# Patient Record
Sex: Female | Born: 1980
Health system: Southern US, Community
[De-identification: ages and names within clinical notes are randomized; demographics above are authoritative.]

## PROBLEM LIST (undated history)

## (undated) DIAGNOSIS — E039 Hypothyroidism, unspecified: Secondary | ICD-10-CM

## (undated) DIAGNOSIS — F32A Depression, unspecified: Secondary | ICD-10-CM

## (undated) DIAGNOSIS — E282 Polycystic ovarian syndrome: Secondary | ICD-10-CM

## (undated) DIAGNOSIS — Z62810 Personal history of physical and sexual abuse in childhood: Secondary | ICD-10-CM

## (undated) DIAGNOSIS — F329 Major depressive disorder, single episode, unspecified: Secondary | ICD-10-CM

## (undated) DIAGNOSIS — D219 Benign neoplasm of connective and other soft tissue, unspecified: Secondary | ICD-10-CM

## (undated) DIAGNOSIS — K219 Gastro-esophageal reflux disease without esophagitis: Secondary | ICD-10-CM

## (undated) HISTORY — DX: Personal history of physical and sexual abuse in childhood: Z62.810

## (undated) HISTORY — DX: Gastro-esophageal reflux disease without esophagitis: K21.9

---

## 2010-11-14 NOTE — L&D Delivery Note (Signed)
Delivery Note At 11:35 AM a viable and healthy female, Renee Thompson,  was delivered via VBAC, Spontaneous (Presentation: Right Occiput Anterior).  APGAR: 6, 8; weight 8 lb 3.4 oz (3725 g).   Placenta status: Intact, Spontaneous.  Cord: 3 vessels with the following complications: None.  Cord pH: na Labored down x 2 hours, pushed x 2 contractions and delivered.  Anesthesia: Epidural  Episiotomy: None Lacerations: 2nd degree;Perineal Suture Repair: 3.0 vicryl Est. Blood Loss (mL): 375  Mom to postpartum.  Baby to skin to skin.  Nigel Bridgeman 10/12/2011, 12:41 PM

## 2010-12-10 DIAGNOSIS — E282 Polycystic ovarian syndrome: Secondary | ICD-10-CM | POA: Insufficient documentation

## 2011-05-04 ENCOUNTER — Other Ambulatory Visit: Payer: Self-pay | Admitting: Endocrinology

## 2011-05-04 DIAGNOSIS — E049 Nontoxic goiter, unspecified: Secondary | ICD-10-CM

## 2011-07-19 ENCOUNTER — Other Ambulatory Visit: Payer: BC Managed Care – PPO

## 2011-07-21 ENCOUNTER — Inpatient Hospital Stay (HOSPITAL_COMMUNITY)
Admission: AD | Admit: 2011-07-21 | Discharge: 2011-07-21 | Disposition: A | Discharge status: Home / Self Care | Payer: BC Managed Care – PPO | Source: Ambulatory Visit | Attending: Obstetrics and Gynecology | Admitting: Obstetrics and Gynecology

## 2011-07-21 DIAGNOSIS — Z2989 Encounter for other specified prophylactic measures: Secondary | ICD-10-CM | POA: Insufficient documentation

## 2011-07-21 DIAGNOSIS — Z298 Encounter for other specified prophylactic measures: Secondary | ICD-10-CM | POA: Insufficient documentation

## 2011-07-21 DIAGNOSIS — Z3480 Encounter for supervision of other normal pregnancy, unspecified trimester: Secondary | ICD-10-CM | POA: Insufficient documentation

## 2011-07-21 MED ORDER — RHO D IMMUNE GLOBULIN 1500 UNIT/2ML IJ SOLN
300.0000 ug | Freq: Once | INTRAMUSCULAR | Status: AC
  Administered 2011-07-21: 300 ug via INTRAMUSCULAR
  Filled 2011-07-21: qty 2

## 2011-07-21 NOTE — Plan of Care (Signed)
Pt given the information booklet on Rhophylac. Explained the time of 1 1/2 hours to complete the process the order. Pt states she will leave after the blood is drawn.

## 2011-07-22 LAB — RH IG WORKUP (INCLUDES ABO/RH)
ABO/RH(D): O NEG
Antibody Screen: NEGATIVE
Unit division: 0

## 2011-10-11 ENCOUNTER — Encounter (HOSPITAL_COMMUNITY): Payer: Self-pay | Admitting: Anesthesiology

## 2011-10-11 ENCOUNTER — Inpatient Hospital Stay (HOSPITAL_COMMUNITY)
Admission: AD | Admit: 2011-10-11 | Discharge: 2011-10-14 | DRG: 373 | Disposition: A | Discharge status: Home / Self Care | Payer: BC Managed Care – PPO | Source: Ambulatory Visit | Attending: Obstetrics and Gynecology | Admitting: Obstetrics and Gynecology

## 2011-10-11 ENCOUNTER — Inpatient Hospital Stay (HOSPITAL_COMMUNITY): Payer: BC Managed Care – PPO | Admitting: Anesthesiology

## 2011-10-11 ENCOUNTER — Encounter (HOSPITAL_COMMUNITY): Payer: Self-pay | Admitting: *Deleted

## 2011-10-11 DIAGNOSIS — Z8659 Personal history of other mental and behavioral disorders: Secondary | ICD-10-CM

## 2011-10-11 DIAGNOSIS — O99892 Other specified diseases and conditions complicating childbirth: Secondary | ICD-10-CM | POA: Diagnosis present

## 2011-10-11 DIAGNOSIS — O9903 Anemia complicating the puerperium: Secondary | ICD-10-CM | POA: Diagnosis not present

## 2011-10-11 DIAGNOSIS — Z22330 Carrier of Group B streptococcus: Secondary | ICD-10-CM

## 2011-10-11 DIAGNOSIS — O99284 Endocrine, nutritional and metabolic diseases complicating childbirth: Secondary | ICD-10-CM | POA: Diagnosis present

## 2011-10-11 DIAGNOSIS — E079 Disorder of thyroid, unspecified: Secondary | ICD-10-CM | POA: Diagnosis present

## 2011-10-11 DIAGNOSIS — E063 Autoimmune thyroiditis: Secondary | ICD-10-CM | POA: Diagnosis present

## 2011-10-11 DIAGNOSIS — O34219 Maternal care for unspecified type scar from previous cesarean delivery: Secondary | ICD-10-CM | POA: Diagnosis present

## 2011-10-11 DIAGNOSIS — Z3490 Encounter for supervision of normal pregnancy, unspecified, unspecified trimester: Secondary | ICD-10-CM

## 2011-10-11 DIAGNOSIS — E039 Hypothyroidism, unspecified: Secondary | ICD-10-CM | POA: Diagnosis present

## 2011-10-11 DIAGNOSIS — D649 Anemia, unspecified: Secondary | ICD-10-CM | POA: Diagnosis not present

## 2011-10-11 DIAGNOSIS — Z6791 Unspecified blood type, Rh negative: Secondary | ICD-10-CM

## 2011-10-11 HISTORY — DX: Polycystic ovarian syndrome: E28.2

## 2011-10-11 HISTORY — DX: Depression, unspecified: F32.A

## 2011-10-11 HISTORY — DX: Hypothyroidism, unspecified: E03.9

## 2011-10-11 HISTORY — DX: Major depressive disorder, single episode, unspecified: F32.9

## 2011-10-11 HISTORY — DX: Benign neoplasm of connective and other soft tissue, unspecified: D21.9

## 2011-10-11 LAB — CBC
Hemoglobin: 12 g/dL (ref 12.0–15.0)
RBC: 5.35 MIL/uL — ABNORMAL HIGH (ref 3.87–5.11)

## 2011-10-11 LAB — COMPREHENSIVE METABOLIC PANEL
ALT: 10 U/L (ref 0–35)
AST: 16 U/L (ref 0–37)
Albumin: 2.8 g/dL — ABNORMAL LOW (ref 3.5–5.2)
CO2: 22 mEq/L (ref 19–32)
Calcium: 9.6 mg/dL (ref 8.4–10.5)
Chloride: 103 mEq/L (ref 96–112)
GFR calc non Af Amer: >90 mL/min (ref >90)
Sodium: 137 mEq/L (ref 135–145)
Total Bilirubin: 0.2 mg/dL — ABNORMAL LOW (ref 0.3–1.2)

## 2011-10-11 LAB — STREP B DNA PROBE: GBS: POSITIVE

## 2011-10-11 LAB — RPR: RPR: NONREACTIVE

## 2011-10-11 MED ORDER — EPHEDRINE 5 MG/ML INJ
10.0000 mg | INTRAVENOUS | Status: DC | PRN
  Administered 2011-10-12: 10 mg via INTRAVENOUS
  Filled 2011-10-11 (×2): qty 4

## 2011-10-11 MED ORDER — LACTATED RINGERS IV SOLN
500.0000 mL | INTRAVENOUS | Status: DC | PRN
  Administered 2011-10-12 (×3): 300 mL via INTRAVENOUS

## 2011-10-11 MED ORDER — PENICILLIN G POTASSIUM 5000000 UNITS IJ SOLR
5.0000 10*6.[IU] | Freq: Once | INTRAVENOUS | Status: AC
  Administered 2011-10-11: 5 10*6.[IU] via INTRAVENOUS
  Filled 2011-10-11: qty 5

## 2011-10-11 MED ORDER — FENTANYL 2.5 MCG/ML BUPIVACAINE 1/10 % EPIDURAL INFUSION (WH - ANES)
INTRAMUSCULAR | Status: DC | PRN
  Administered 2011-10-11: 14 mL/h via EPIDURAL

## 2011-10-11 MED ORDER — DIPHENHYDRAMINE HCL 50 MG/ML IJ SOLN: 12.5000 mg | INTRAMUSCULAR | Status: DC | PRN

## 2011-10-11 MED ORDER — DEXTROSE 5 % IV SOLN
2.5000 10*6.[IU] | INTRAVENOUS | Status: DC
  Administered 2011-10-11 – 2011-10-12 (×3): 2.5 10*6.[IU] via INTRAVENOUS
  Filled 2011-10-11 (×8): qty 2.5

## 2011-10-11 MED ORDER — CITRIC ACID-SODIUM CITRATE 334-500 MG/5ML PO SOLN: 30.0000 mL | ORAL | Status: DC | PRN

## 2011-10-11 MED ORDER — OXYTOCIN BOLUS FROM INFUSION
500.0000 mL | Freq: Once | INTRAVENOUS | Status: DC
  Filled 2011-10-11: qty 500
  Filled 2011-10-11: qty 1000

## 2011-10-11 MED ORDER — FENTANYL 2.5 MCG/ML BUPIVACAINE 1/10 % EPIDURAL INFUSION (WH - ANES)
14.0000 mL/h | INTRAMUSCULAR | Status: DC
  Administered 2011-10-12 (×3): 14 mL/h via EPIDURAL
  Filled 2011-10-11 (×4): qty 60

## 2011-10-11 MED ORDER — OXYTOCIN 10 UNIT/ML IJ SOLN: 10.0000 [IU] | Freq: Once | INTRAMUSCULAR | Status: DC

## 2011-10-11 MED ORDER — PHENYLEPHRINE 40 MCG/ML (10ML) SYRINGE FOR IV PUSH (FOR BLOOD PRESSURE SUPPORT)
80.0000 ug | PREFILLED_SYRINGE | INTRAVENOUS | Status: DC | PRN
  Filled 2011-10-11: qty 5

## 2011-10-11 MED ORDER — ACETAMINOPHEN 325 MG PO TABS: 650.0000 mg | ORAL_TABLET | ORAL | Status: DC | PRN

## 2011-10-11 MED ORDER — OXYTOCIN 20 UNITS IN LACTATED RINGERS INFUSION - SIMPLE: 125.0000 mL/h | Freq: Once | INTRAVENOUS | Status: DC

## 2011-10-11 MED ORDER — PHENYLEPHRINE 40 MCG/ML (10ML) SYRINGE FOR IV PUSH (FOR BLOOD PRESSURE SUPPORT): 80.0000 ug | PREFILLED_SYRINGE | INTRAVENOUS | Status: DC | PRN

## 2011-10-11 MED ORDER — IBUPROFEN 600 MG PO TABS: 600.0000 mg | ORAL_TABLET | Freq: Four times a day (QID) | ORAL | Status: DC | PRN

## 2011-10-11 MED ORDER — LACTATED RINGERS IV SOLN
500.0000 mL | Freq: Once | INTRAVENOUS | Status: AC
  Administered 2011-10-11: 500 mL via INTRAVENOUS

## 2011-10-11 MED ORDER — OXYCODONE-ACETAMINOPHEN 5-325 MG PO TABS: 2.0000 | ORAL_TABLET | ORAL | Status: DC | PRN

## 2011-10-11 MED ORDER — ONDANSETRON HCL 4 MG/2ML IJ SOLN: 4.0000 mg | Freq: Four times a day (QID) | INTRAMUSCULAR | Status: DC | PRN

## 2011-10-11 MED ORDER — LIDOCAINE HCL (PF) 1 % IJ SOLN
30.0000 mL | INTRAMUSCULAR | Status: DC | PRN
  Administered 2011-10-12: 30 mL via SUBCUTANEOUS
  Filled 2011-10-11: qty 30

## 2011-10-11 MED ORDER — EPHEDRINE 5 MG/ML INJ: 10.0000 mg | INTRAVENOUS | Status: DC | PRN

## 2011-10-11 MED ORDER — SODIUM BICARBONATE 8.4 % IV SOLN
INTRAVENOUS | Status: DC | PRN
  Administered 2011-10-11: 5 mL via EPIDURAL

## 2011-10-11 MED ORDER — LACTATED RINGERS IV SOLN
INTRAVENOUS | Status: DC
  Administered 2011-10-11 – 2011-10-12 (×4): via INTRAVENOUS

## 2011-10-11 NOTE — Anesthesia Preprocedure Evaluation (Signed)
Anesthesia Evaluation  Patient identified by MRN, date of birth, ID band Patient awake    Reviewed: Allergy & Precautions, H&P , Patient's Chart, lab work & pertinent test results  Airway Mallampati: III TM Distance: >3 FB Neck ROM: full    Dental  (+) Teeth Intact   Pulmonary  clear to auscultation        Cardiovascular regular Normal    Neuro/Psych    GI/Hepatic   Endo/Other  Morbid obesity  Renal/GU      Musculoskeletal   Abdominal   Peds  Hematology   Anesthesia Other Findings       Reproductive/Obstetrics (+) Pregnancy                           Anesthesia Physical Anesthesia Plan  ASA: III  Anesthesia Plan: Epidural   Post-op Pain Management:    Induction:   Airway Management Planned:   Additional Equipment:   Intra-op Plan:   Post-operative Plan:   Informed Consent: I have reviewed the patients History and Physical, chart, labs and discussed the procedure including the risks, benefits and alternatives for the proposed anesthesia with the patient or authorized representative who has indicated his/her understanding and acceptance.   Dental Advisory Given  Plan Discussed with:   Anesthesia Plan Comments: (Labs checked- platelets confirmed with RN in room. Fetal heart tracing, per RN, reported to be stable enough for sitting procedure. Discussed epidural, and patient consents to the procedure:  included risk of possible headache,backache, failed block, allergic reaction, and nerve injury. This patient was asked if she had any questions or concerns before the procedure started. )        Anesthesia Quick Evaluation

## 2011-10-11 NOTE — Progress Notes (Signed)
Patient ID: Artemisa Sladek, female   DOB: 01/26/1981, 30 y.o.   MRN: [id number] .Subjective: Coping with ctx, ambulating and changing positions, on hands and knees in shower now. Declines any pain meds at present. Difficulty tracing FHR secondary to pt movement and body habitus   Objective: BP 131/77  Pulse 104  Temp(Src) 97.3 F (36.3 C) (Oral)  Resp 20  Ht 5\' 8"  (1.727 m)  Wt 127.914 kg (282 lb)  BMI 42.88 kg/m2   FHT:  FHR: 140 bpm, variability: moderate,  accelerations:  Present,  decelerations:  Absent UC:   regular, every 4-5 minutes SVE:   Dilation: 4.5 Effacement (%): 100 Station: -1 Exam by:: e.foley,rn    Assessment / Plan: Spontaneous labor, progressing normally GBS pos, PCN given   Fetal Wellbeing:  Category I Pain Control:  Labor support without medications  FHR overall reassuring  BP stable, PIH labs normal   Update physician PRN  Malissa Hippo 10/11/2011, 8:18 PM

## 2011-10-11 NOTE — Progress Notes (Signed)
Regular ctx's for an hour now 4-48min.  No bleeding,no water leaking.  Bloody mucous over weekend.  Primary c/s for FTP

## 2011-10-11 NOTE — H&P (Signed)
Renee Thompson is a 30 y.o. female presenting for onset of ctx this afternoon and have been 4-5 min apart and getting stronger. Has had some bloody mucous discharge but no LOF, reports +FM. Pregnancy significant for: 1. +GBS 2. Hashimoto's dx, on synthroid, has been managed by Dr. Talmage Nap 3. HX C/S for FTP, desires TOLAC 4. Hx PPD   Maternal Medical History:  Reason for admission: Reason for admission: contractions.  Contractions: Onset was 1-2 hours ago.   Frequency: regular.   Duration is approximately 60 seconds.   Perceived severity is moderate.    Fetal activity: Perceived fetal activity is normal.   Last perceived fetal movement was within the past hour.    Prenatal complications: no prenatal complications   OB History    Grav Para Term Preterm Abortions TAB SAB Ect Mult Living   2 1 1       1      Past Medical History  Diagnosis Date  . Depression     postpartum  . Fibroid     diagnosed 2010 pregnancy  . Hypothyroidism     on synthroid  . Polycystic ovarian syndrome    Past Surgical History  Procedure Date  . Cesarean section     failure to progress   Family History: family history is not on file. Varicose veins - mom;  Diabetes - MGM;  Epilepsy - brother;  Testicular CA - 1st cousin Social History:  does not have a smoking history on file. She does not have any smokeless tobacco history on file. She reports that she does not drink alcohol or use illicit drugs.  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   Review of Systems  All other systems reviewed and are negative.    Dilation: 3 Effacement (%): 100 Station: 0 Exam by:: Sanda Klein, cnm Blood pressure 151/84, pulse 104, temperature 97.3 F (36.3 C), temperature source Oral, resp. rate 20, height 5\' 8"  (1.727 m), weight 127.914 kg (282 lb).   Maternal Exam:  Uterine Assessment: Contraction strength is mild.  Contraction duration is 60 seconds. Contraction frequency  is regular.   Abdomen: Estimated fetal weight is 8#.   Fetal presentation: vertex  Introitus: Normal vulva. Normal vagina.  Ferning test: not done.   Pelvis: adequate for delivery.   Cervix: Cervix evaluated by digital exam.     Physical Exam  Nursing note and vitals reviewed. Constitutional: She is oriented to person, place, and time. She appears well-developed and well-nourished.  Neck: Normal range of motion.  Cardiovascular: Normal rate and normal heart sounds.   Respiratory: Effort normal.  GI: Soft. There is no tenderness.  Genitourinary: Vagina normal.  Musculoskeletal: Normal range of motion.  Neurological: She is alert and oriented to person, place, and time.  Skin: Skin is warm and dry.  Psychiatric: She has a normal mood and affect. Her behavior is normal.    Prenatal labs: ABO, Rh: --/--/O NEG (09/06 1315) Antibody: NEG (09/06 1315) Rubella: Immune (11/27 0000) RPR: Nonreactive (11/27 0000)  HBsAg: Negative (11/27 0000)  HIV: Non-reactive (11/27 0000)  GBS: Positive (11/27 0000)  1hr gtt normal Per pt - last TSH in Sept was 1.8    Assessment/Plan: IUP at [redacted]w[redacted]d Early/active labor +GBS Desires TOLAC Hashimoto's dx - stable on synthroid Hx PPD Prefers non-interventive birth, has doula BP slightly elevated, will continue to observe and check PIH labs  Admit to birthing suites, CNM care, Dr Pennie Rushing attending PCN  per protocol for GBS prophylaxis Routine CNM orders  LILLARD,SHELLEY M 10/11/2011, 6:18 PM

## 2011-10-11 NOTE — Anesthesia Procedure Notes (Signed)
Epidural Patient location during procedure: OB  Preanesthetic Checklist Completed: patient identified, site marked, surgical consent, pre-op evaluation, timeout performed, IV checked, risks and benefits discussed and monitors and equipment checked  Epidural Patient position: sitting Prep: site prepped and draped and DuraPrep Patient monitoring: continuous pulse ox and blood pressure Approach: midline Injection technique: LOR air  Needle:  Needle type: Tuohy  Needle gauge: 17 G Needle length: 9 cm Needle insertion depth: 6 cm Catheter type: closed end flexible Catheter size: 19 Gauge Catheter at skin depth: 12 cm Test dose: negative  Assessment Events: blood not aspirated, injection not painful, no injection resistance, negative IV test and no paresthesia  Additional Notes Dosing of Epidural:  1st dose, through needle ............................................. epi 1:200K + Xylocaine 40 mg  2nd dose, through catheter, after waiting 3 minutes.....epi 1:200K + Xylocaine 60 mg  3rd dose, through catheter after waiting 3 minutes .............................Marcaine   5mg   ( mg Marcaine are expressed as equivilent  cc's medication removed from the 0.1%Bupiv / fentanyl syringe from L&D pump)  ( 2% Xylo charted as a single dose in Epic Meds for ease of charting; actual dosing was fractionated as above, for saftey's sake)  As each dose occurred, patient was free of IV sx; and patient exhibited no evidence of SA injection.  Patient is more comfortable after epidural dosed. Please see RN's note for documentation of vital signs,and FHR which are stable.       

## 2011-10-11 NOTE — Progress Notes (Signed)
Subjective: Pt currently getting epidural.  Doula and husband remain supportive at [facility].  Pt reports prodromal labor since 10/06/11.  No LOF.  Scant show today.  GFM.  "Exhausted."  No PIH s/s.  Objective: BP 145/68  Pulse 85  Temp(Src) 98 F (36.7 C) (Oral)  Resp 22  Ht 5\' 8"  (1.727 m)  Wt 127.914 kg (282 lb)  BMI 42.88 kg/m2     FHT:  FHR: 145 bpm, variability: moderate,  accelerations:  Present,  decelerations:  Absent UC:   irregular, every 2-6 minutes SVE:   Dilation: 5 Effacement (%): 100 Station: -1 Exam by:: Renee Thompson,cnm  Labs: Lab Results  Component Value Date   WBC 19.8* 10/11/2011   HGB 12.0 10/11/2011   HCT 38.6 10/11/2011   MCV 72.1* 10/11/2011   PLT 304 10/11/2011    Assessment / Plan: Protracted active phase; TOLAC;   Labor: Will allow pt to get comfortable w/ epidural, and if no change, will augment w/ Pitocin Preeclampsia:  no signs or symptoms of toxicity Fetal Wellbeing:  Category I Pain Control:  Epidural I/D:  n/a Anticipated MOD:  NSVD  Renee Thompson H 10/11/2011, 10:47 PM

## 2011-10-12 ENCOUNTER — Encounter (HOSPITAL_COMMUNITY): Payer: Self-pay | Admitting: Obstetrics and Gynecology

## 2011-10-12 DIAGNOSIS — O34219 Maternal care for unspecified type scar from previous cesarean delivery: Secondary | ICD-10-CM | POA: Diagnosis not present

## 2011-10-12 LAB — RPR: RPR Ser Ql: NONREACTIVE

## 2011-10-12 MED ORDER — TETANUS-DIPHTH-ACELL PERTUSSIS 5-2.5-18.5 LF-MCG/0.5 IM SUSP: 0.5000 mL | Freq: Once | INTRAMUSCULAR | Status: DC

## 2011-10-12 MED ORDER — LEVOTHYROXINE SODIUM 125 MCG PO TABS
125.0000 ug | ORAL_TABLET | Freq: Every day | ORAL | Status: DC
  Filled 2011-10-12 (×2): qty 1

## 2011-10-12 MED ORDER — MAGNESIUM HYDROXIDE 400 MG/5ML PO SUSP: 30.0000 mL | ORAL | Status: DC | PRN

## 2011-10-12 MED ORDER — LACTATED RINGERS IV SOLN
INTRAVENOUS | Status: DC
  Administered 2011-10-12: 300 mL via INTRAUTERINE

## 2011-10-12 MED ORDER — WITCH HAZEL-GLYCERIN EX PADS: 1.0000 "application " | MEDICATED_PAD | CUTANEOUS | Status: DC | PRN

## 2011-10-12 MED ORDER — MEASLES, MUMPS & RUBELLA VAC ~~LOC~~ INJ: 0.5000 mL | INJECTION | Freq: Once | SUBCUTANEOUS | Status: DC

## 2011-10-12 MED ORDER — LANOLIN HYDROUS EX OINT: TOPICAL_OINTMENT | CUTANEOUS | Status: DC | PRN

## 2011-10-12 MED ORDER — ONDANSETRON HCL 4 MG PO TABS: 4.0000 mg | ORAL_TABLET | ORAL | Status: DC | PRN

## 2011-10-12 MED ORDER — OXYCODONE-ACETAMINOPHEN 5-325 MG PO TABS: 1.0000 | ORAL_TABLET | ORAL | Status: DC | PRN

## 2011-10-12 MED ORDER — PRENATAL PLUS 27-1 MG PO TABS
1.0000 | ORAL_TABLET | Freq: Every day | ORAL | Status: DC
  Administered 2011-10-12 – 2011-10-13 (×2): 1 via ORAL
  Filled 2011-10-12 (×2): qty 1

## 2011-10-12 MED ORDER — ZOLPIDEM TARTRATE 5 MG PO TABS: 5.0000 mg | ORAL_TABLET | Freq: Every evening | ORAL | Status: DC | PRN

## 2011-10-12 MED ORDER — SIMETHICONE 80 MG PO CHEW: 80.0000 mg | CHEWABLE_TABLET | ORAL | Status: DC | PRN

## 2011-10-12 MED ORDER — SENNOSIDES-DOCUSATE SODIUM 8.6-50 MG PO TABS
2.0000 | ORAL_TABLET | Freq: Every day | ORAL | Status: DC
  Administered 2011-10-12 – 2011-10-13 (×2): 2 via ORAL

## 2011-10-12 MED ORDER — IBUPROFEN 600 MG PO TABS
600.0000 mg | ORAL_TABLET | Freq: Four times a day (QID) | ORAL | Status: DC
  Administered 2011-10-12 – 2011-10-14 (×7): 600 mg via ORAL
  Filled 2011-10-12 (×7): qty 1

## 2011-10-12 MED ORDER — FAMOTIDINE 20 MG PO TABS
20.0000 mg | ORAL_TABLET | Freq: Two times a day (BID) | ORAL | Status: DC
  Administered 2011-10-12 – 2011-10-13 (×3): 20 mg via ORAL
  Filled 2011-10-12 (×3): qty 1

## 2011-10-12 MED ORDER — OXYTOCIN 20 UNITS IN LACTATED RINGERS INFUSION - SIMPLE
1.0000 m[IU]/min | INTRAVENOUS | Status: DC
  Administered 2011-10-12: 4 m[IU]/min via INTRAVENOUS
  Administered 2011-10-12: 333 m[IU]/min via INTRAVENOUS
  Administered 2011-10-12: 1 m[IU]/min via INTRAVENOUS
  Administered 2011-10-12: 3 m[IU]/min via INTRAVENOUS

## 2011-10-12 MED ORDER — DIPHENHYDRAMINE HCL 25 MG PO CAPS: 25.0000 mg | ORAL_CAPSULE | Freq: Four times a day (QID) | ORAL | Status: DC | PRN

## 2011-10-12 MED ORDER — BENZOCAINE-MENTHOL 20-0.5 % EX AERO
INHALATION_SPRAY | CUTANEOUS | Status: AC
  Filled 2011-10-12: qty 56

## 2011-10-12 MED ORDER — TERBUTALINE SULFATE 1 MG/ML IJ SOLN: 0.2500 mg | Freq: Once | INTRAMUSCULAR | Status: DC | PRN

## 2011-10-12 MED ORDER — ONDANSETRON HCL 4 MG/2ML IJ SOLN: 4.0000 mg | INTRAMUSCULAR | Status: DC | PRN

## 2011-10-12 MED ORDER — BENZOCAINE-MENTHOL 20-0.5 % EX AERO: 1.0000 "application " | INHALATION_SPRAY | CUTANEOUS | Status: DC | PRN

## 2011-10-12 MED ORDER — DIBUCAINE 1 % RE OINT: 1.0000 "application " | TOPICAL_OINTMENT | RECTAL | Status: DC | PRN

## 2011-10-12 MED ORDER — LEVOTHYROXINE SODIUM 175 MCG PO TABS
175.0000 ug | ORAL_TABLET | Freq: Every day | ORAL | Status: DC
  Administered 2011-10-12 – 2011-10-13 (×2): 175 ug via ORAL
  Filled 2011-10-12 (×3): qty 1

## 2011-10-12 NOTE — Progress Notes (Signed)
cnm notified of fhr tracing and that rn does not feel comfortable with starting pitocin at this time, order to keep monitoring fhr and to check pt and start pitocin with reassuring tracing

## 2011-10-12 NOTE — Progress Notes (Signed)
Subjective: Pt in high-fowler's position s/p n/v episode.  RN checked pt around 6am and 9cm; pitocin remains on 55mu/min.  Pt feeling some pressure.  Occ'l variable/early, but overall has been reassuring and no lates in last 2-3 hrs.  Doula and husband remain at bs.    Objective: BP 125/69  Pulse 106  Temp(Src) 98 F (36.7 C) (Oral)  Resp 20  Ht 5\' 8"  (1.727 m)  Wt 127.914 kg (282 lb)  BMI 42.88 kg/m2  SpO2 100%      FHT:  FHR: 150 bpm, variability: moderate,  accelerations:  Abscent,  decelerations:  Absent UC:   regular, every 2-4 minutes SVE:   Dilation: 9 Effacement (%): 100 Station: 0 Exam by:: e.foley,rn  Labs: Lab Results  Component Value Date   WBC 19.8* 10/11/2011   HGB 12.0 10/11/2011   HCT 38.6 10/11/2011   MCV 72.1* 10/11/2011   PLT 304 10/11/2011    Assessment / Plan: Augmentation of labor, progressing well  Labor: TOLAC; Transition; SROM at 0504 Preeclampsia:  no signs or symptoms of toxicity Fetal Wellbeing:  Category I Pain Control:  Epidural I/D:  n/a Anticipated MOD:  NSVD Will continue current POC. C/w MD prn. Misty Foutz H 10/12/2011, 6:44 AM

## 2011-10-12 NOTE — Progress Notes (Signed)
  Subjective: Called by RN to clarify levothyroxine dose--175 mcg is correct dose, and patient wishes to take dose now, then in am.  Objective: Blood pressure 118/67, pulse 100, temperature 98.6 F (37 C), temperature source Oral, resp. rate 18, height 5\' 8"  (1.727 m), weight 127.914 kg (282 lb), SpO2 98.00%, unknown if currently breastfeeding.    Assessment/Plan: Change med dosage to 175 mcg q day, with 1st dose now. Next dose in am at time patient usually takes med.   LOS: 1 day   Nigel Bridgeman 10/12/2011, 6:49 PM

## 2011-10-12 NOTE — Progress Notes (Signed)
Steelman,cnm on unit to review fhr tracing, aware of minimal variablity, cnm says to leave pitcoin where it is unless fhr becomes reactive

## 2011-10-12 NOTE — Progress Notes (Signed)
  Subjective: Increased pressure, agrees to amnioinfusion Repetitive variables   Objective: BP 114/65  Pulse 62  Temp(Src) 97.7 F (36.5 C) (Oral)  Resp 18  Ht 5\' 8"  (1.727 m)  Wt 127.914 kg (282 lb)  BMI 42.88 kg/m2  SpO2 100%      FHT: 140 LTV min severe variable decels responds to scalp stimulation UC:  q  3-5 MVU 150 SVE:   Dilation: 10 Effacement (%): 100 Station: +1 Exam by:: Chip Boer Tin Engram,c nm Variables resolved to mild with position change  Labs: Lab Results  Component Value Date   WBC 19.8* 10/11/2011   HGB 12.0 10/11/2011   HCT 38.6 10/11/2011   MCV 72.1* 10/11/2011   PLT 304 10/11/2011    Assessment / Plan: 2nd stage labor Labor down Amnioinfusion now Dameer Speiser 10/12/2011, 9:30 AM

## 2011-10-12 NOTE — Progress Notes (Signed)
Subjective: Pt resting on Rt side; comfortable. RN called around 0030 to report series of late decels, which she tried usual interventions for, and didn't resolve, but did improve after ephedrine.  BP post-epidural low 100s/50s-60s overall.   Pt denies fever, chills; reports u/a at office Mon suspicious for UTI, and cx sent, which may be lending to WBC; also may be reflective of prodromal labor for 4 plus days.  Doula and husband remain at [facility]. Objective: BP 103/59  Pulse 94  Temp(Src) 97.8 F (36.6 C) (Oral)  Resp 20  Ht 5\' 8"  (1.727 m)  Wt 127.914 kg (282 lb)  BMI 42.88 kg/m2  SpO2 100%      FHT:  FHR: 150 bpm, variability: moderate,  accelerations:  Present,  decelerations:  Present series of lates w/ ctxs post-epidural UC:   irregular, every 4-8 minutes SVE:   5/80/-1, to pt's left, w/ bulging of membranes--no change.  Labs: Lab Results  Component Value Date   WBC 19.8* 10/11/2011   HGB 12.0 10/11/2011   HCT 38.6 10/11/2011   MCV 72.1* 10/11/2011   PLT 304 10/11/2011    Assessment / Plan: Protracted active phase; TOLAC; leucocytosis; lates post-epidural and likely r/t decrease in BP  Labor: no cx change in 5-6 hrs Preeclampsia:  no signs or symptoms of toxicity Fetal Wellbeing:  Category II Pain Control:  Epidural I/D:  n/a Anticipated MOD:  NSVD  If no decels over next 30 minutes, will begin low-dose Pitocin.    Ever Halberg H 10/12/2011, 1:06 AM

## 2011-10-12 NOTE — Progress Notes (Signed)
cnm gives order to start pitocin, rn requests more time to monitor fhr tracing before doing so, cnm says this is fine

## 2011-10-12 NOTE — Progress Notes (Signed)
   Subjective: Comfortable with epidural, pressure that increases with contractions, no urge to push, agrees to have IUPC  Objective: BP 94/71  Pulse 96  Temp(Src) 97.7 F (36.5 C) (Oral)  Resp 18  Ht 5\' 8"  (1.727 m)  Wt 127.914 kg (282 lb)  BMI 42.88 kg/m2  SpO2 100%      FHT:  140s cat 1 UC:   regular, every 3 minutes SVE:   Dilation: 9 Effacement (%): 100 Station: +1 Exam by:: Nigel Bridgeman, cnm IUPC inserted easily--initial MVU 120 IV Pit at 5  Labs: Lab Results  Component Value Date   WBC 19.8* 10/11/2011   HGB 12.0 10/11/2011   HCT 38.6 10/11/2011   MCV 72.1* 10/11/2011   PLT 304 10/11/2011    Assessment / Plan: prior c/s desires VBAC transition labor, inadequate MVUs Titrate pitocin for adequate contractions, position changed to facilitate descent  Nigel Bridgeman 10/12/2011, 8:48 AM

## 2011-10-12 NOTE — Progress Notes (Signed)
Subjective: Just received phone call from pt's RN reporting SROM around 0504, clear.  Cx has progressed on Pitocin, and RN able to titrate to 24mu/min currently.  Pt excited about SROM per RN.  Doula has slept some in chair at bs, and husband remains at bs.  RN reports some moderate variables which have now become mild since repositioned and noted SROM.    Objective: BP 117/69  Pulse 117  Temp(Src) 97.6 F (36.4 C) (Oral)  Resp 20  Ht 5\' 8"  (1.727 m)  Wt 127.914 kg (282 lb)  BMI 42.88 kg/m2  SpO2 100%      FHT:  FHR: 140 bpm, variability: moderate,  accelerations:  Present,  decelerations:  Present variables over last hr UC:   regular, every 3-4 minutes SVE:   Dilation: 7.5 Effacement (%): 100 Station: 0 Exam by:: foley,rn  Labs: Lab Results  Component Value Date   WBC 19.8* 10/11/2011   HGB 12.0 10/11/2011   HCT 38.6 10/11/2011   MCV 72.1* 10/11/2011   PLT 304 10/11/2011    Assessment / Plan: Transition; TOLAC; SROM 0504  Labor: Progressing on Pitocin over last 3 hrs Preeclampsia:  no signs or symptoms of toxicity Fetal Wellbeing:  Category I Pain Control:  Epidural I/D:  n/a Anticipated MOD:  NSVD IUPC prn amnioinfusion if variables persist, and/or to discern MVU's. Continue current POC. Renezmae Canlas H 10/12/2011, 5:20 AM

## 2011-10-12 NOTE — Progress Notes (Signed)
Subjective: Pitocin started around 0215; RN's exam 6/100/-1 mid position.  2 lates around 0235.  Oxygen remains on.  Pitocin remains on at present at 57mu/min.  BP still running low 100s/50-60s.  Pt has received IVFB.  Pt sleeping intermittently.  Doula and husband remain at bs.   Objective: BP 102/59  Pulse 102  Temp(Src) 97.8 F (36.6 C) (Oral)  Resp 20  Ht 5\' 8"  (1.727 m)  Wt 127.914 kg (282 lb)  BMI 42.88 kg/m2  SpO2 100%      FHT:  FHR: 150 bpm, variability: minimal ,  accelerations:  Abscent,  decelerations:  Present 2 lates in last hr since Pitocin turned on UC:   regular, every 3-4 minutes SVE:   Dilation: 6 Effacement (%): 100 Station: -1 Exam by:: foley,rn  Labs: Lab Results  Component Value Date   WBC 19.8* 10/11/2011   HGB 12.0 10/11/2011   HCT 38.6 10/11/2011   MCV 72.1* 10/11/2011   PLT 304 10/11/2011    Assessment / Plan: Protracted active phase  Labor: TOLAC Preeclampsia:  no signs or symptoms of toxicity Fetal Wellbeing:  Category II Pain Control:  Epidural I/D:  n/a Anticipated MOD:  NSVD Will leave Pitocin on 8mu/min at present w/ minimal variability; will turn off if decels return.  Consider AROM prn.  If moderate variability returns or accels, RN instructed to increase Pitocin as able.  C/w MD prn.  Angeline Trick H 10/12/2011, 3:08 AM

## 2011-10-13 ENCOUNTER — Encounter (HOSPITAL_COMMUNITY): Payer: Self-pay | Admitting: Anesthesiology

## 2011-10-13 LAB — CBC
HCT: 29.9 % — ABNORMAL LOW (ref 36.0–46.0)
Hemoglobin: 9.2 g/dL — ABNORMAL LOW (ref 12.0–15.0)
RBC: 4.09 MIL/uL (ref 3.87–5.11)
WBC: 15.5 10*3/uL — ABNORMAL HIGH (ref 4.0–10.5)

## 2011-10-13 MED ORDER — RHO D IMMUNE GLOBULIN 1500 UNIT/2ML IJ SOLN
300.0000 ug | Freq: Once | INTRAMUSCULAR | Status: AC
  Administered 2011-10-13: 300 ug via INTRAMUSCULAR
  Filled 2011-10-13: qty 2

## 2011-10-13 NOTE — Progress Notes (Signed)
Patient ID: Renee Thompson, female   DOB: [birth year], 30 y.o.   MRN: [id number] Post Partum Day 1 Subjective: no complaints, up ad lib without syncope, voiding, tolerating PO, + flatus, No BM Pain well controlled with po meds, mild soreness, very pleased with VBAC delivery BF starting, baby not latching, has lots of reflux Mood stable, bonding well   Objective: Blood pressure 95/65, pulse 104, temperature 97.7 F (36.5 C), temperature source Oral, resp. rate 18, height 5\' 8"  (1.727 m), weight 127.914 kg (282 lb), SpO2 98.00%, unknown if currently breastfeeding.  Physical Exam:  General: alert and icteric Lungs: CTAB Heart: RRR Breasts: soft, nipples intact Lochia: appropriate Uterine Fundus: firm Perineum: DVT Evaluation: No evidence of DVT seen on physical exam. Negative Homan's sign. Calf/Ankle edema is present.   Basename 10/13/11 0600 10/11/11 1900  HGB 9.2* 12.0  HCT 29.9* 38.6    Assessment/Plan: Plan for discharge tomorrow, Breastfeeding, Lactation consult and Contraception plans condoms Has appointment with Dr Talmage Nap for thyroid f/u in January      LOS: 2 days   Morrison Mcbryar M 10/13/2011, 9:07 AM

## 2011-10-13 NOTE — Progress Notes (Signed)
Patient was referred for history of depression/anxiety. * Referral screened out by Clinical Social Worker because none of the following criteria appear to apply: ~ History of anxiety/depression during this pregnancy, or of post-partum depression. ~ Diagnosis of anxiety and/or depression within last 3 years ~ History of depression due to pregnancy loss/loss of child OR * Patient's symptoms currently being treated with medication and/or therapy. Please contact the Clinical Social Worker if needs arise, or if patient requests.  SW reviewed medical record, which states that PPD is possibly due to Thyroid levels.  SW notes that MOB is on Synthroid.  SW discussed MOB's affect and mood with beside RN who states no concerns.  SW asked that SW be contacted if MOB displays depressive symptoms, but otherwise has screened out referral. 

## 2011-10-13 NOTE — Anesthesia Postprocedure Evaluation (Signed)
  Anesthesia Post-op Note  Patient: Renee Thompson  Procedure(s) Performed: * No surgery found *  Patient Location: Mother/Baby  Anesthesia Type: Epidural  Level of Consciousness: alert  and oriented  Airway and Oxygen Therapy: Patient Spontanous Breathing  Post-op Pain: mild  Post-op Assessment: Patient's Cardiovascular Status Stable and Respiratory Function Stable  Post-op Vital Signs: stable  Complications: No apparent anesthesia complications

## 2011-10-14 ENCOUNTER — Encounter (HOSPITAL_COMMUNITY)
Admission: RE | Admit: 2011-10-14 | Discharge: 2011-10-14 | Disposition: A | Discharge status: Home / Self Care | Payer: BC Managed Care – PPO | Source: Ambulatory Visit | Attending: Obstetrics and Gynecology | Admitting: Obstetrics and Gynecology

## 2011-10-14 DIAGNOSIS — O923 Agalactia: Secondary | ICD-10-CM | POA: Insufficient documentation

## 2011-10-14 LAB — RH IG WORKUP (INCLUDES ABO/RH)
ABO/RH(D): O NEG
Fetal Screen: NEGATIVE
Unit division: 0

## 2011-10-14 MED ORDER — OXYCODONE-ACETAMINOPHEN 5-325 MG PO TABS: 1.0000 | ORAL_TABLET | ORAL | Status: AC | PRN

## 2011-10-14 MED ORDER — IBUPROFEN 600 MG PO TABS: 600.0000 mg | ORAL_TABLET | Freq: Four times a day (QID) | ORAL | Status: AC

## 2011-10-14 NOTE — Progress Notes (Signed)
Patient ID: Renee Thompson, female   DOB: 04/30/1981, 30 y.o.   MRN: [id number] Post Partum Day 2 Subjective: no complaints, up ad lib without syncope, voiding, tolerating PO, + flatus, no BM Pain well controlled with po meds BF difficulty, using bottle now, and pumping, first baby was a challenge as well, has flat nipples Mood stable, bonding well   Objective: Blood pressure 120/86, pulse 70, temperature 97.8 F (36.6 C), temperature source Oral, resp. rate 18, height 5\' 8"  (1.727 m), weight 127.914 kg (282 lb), SpO2 100.00%, unknown if currently breastfeeding.  Physical Exam:  General: alert and no distress Lungs: CTAB Heart: RRR Breasts: soft, nipples intact Lochia: appropriate Uterine Fundus: firm Perineum: wnl DVT Evaluation: No evidence of DVT seen on physical exam. Negative Homan's sign. Calf/Ankle edema is present. Mild, bilateral, better this morning   Basename 10/13/11 0600 10/11/11 1900  HGB 9.2* 12.0  HCT 29.9* 38.6    Assessment/Plan: Discharge home, Breastfeeding, Lactation consult and Contraception plans condoms, considering paraguard Anemia, rec floridex rx for motrin and percocet To continue synthyroid at home, f/u with Dr Talmage Nap schedule for January      LOS: 3 days   Rutledge Selsor M 10/14/2011, 7:51 AM

## 2011-10-18 ENCOUNTER — Ambulatory Visit (HOSPITAL_COMMUNITY)
Admit: 2011-10-18 | Discharge: 2011-10-18 | Disposition: A | Discharge status: Home / Self Care | Payer: BC Managed Care – PPO | Attending: Obstetrics and Gynecology | Admitting: Obstetrics and Gynecology

## 2011-10-18 NOTE — Progress Notes (Signed)
Adult Lactation Consultation Outpatient Visit Note  Patient Name: Renee Thompson Date of Birth: September 17, 1981 Gestational Age at Delivery: [redacted]w[redacted]d Type of Delivery:vaginal del on 11-28, 8-3   Breastfeeding History: Frequency of Breastfeeding:  Length of Feeding:  Voids: 8-10 Stools: 8 yellow seedy  Supplementing / Method: Pumping:  Type of Pump:hospital grade symphony    Frequency: after every feeding 8-10 times daily  Volume:  2 ounces  Comments: [name] has been bottle feeding since discharge from hospital. Mothers nipples were flat and infant was a difficult latch in hospital. Mother was fit with nipple shield and SNS was set up prior to discharge. Mother states that she really wants to breastfeed infant. She has not been using nipple shield or SNS. Mother is pumping 8-10 times daily. Mothers breast are full and she does have adequate supply.   Consultation Evaluation: Attempt to latch infant using  nipple shield multiple times until latch  sustained. Infant latched for 20 mins. with lots of breast compression to keep infant suckling. Gave 5ml of EBM with monojet while feeding with nipple shield. Infant transferred 30 ml from first breast. Assisted with latch on 2nd breast. [name] breastfed well for another 15 mins. And transferred 40 ml.  Total feeding form breast 70 ml. Bottle given after feeding with 15 ml of EBM. Lots of teaching with mother. Mother receptive to teaching.   Initial Feeding Assessment: Pre-feed [id number] Post-feed [id number] Amount Transferred:68ml Comments:  Additional Feeding Assessment: Pre-feed [id number] Post-feed [id number] Amount Transferred:74ml Comments:  Additional Feeding Assessment: Pre-feed Weight: Post-feed Weight: Amount Transferred: Comments:  Total Breast milk Transferred this Visit: 70ml Total Supplement Given: 15ml  Additional Interventions: Inst mother to spend lots of skin to skin time with infant and to cue-base feed. inst mother  to attempt to breastfed every 2-3 hrs using nipple shield. inst her to pump after each feeding for 15 mins at least 8 times daily. Suggested to supplement 15-30 ml after each feeding using bottle with slow flow nipple.  Recommend that mother follow up with lactation services next week to do another feeding assessment and weight check.  Follow-Up  Dec 10 at 1:00   Stevan Born [facility] 10/18/2011, 2:08 PM

## 2011-10-18 NOTE — Discharge Summary (Signed)
   Obstetric Discharge Summary Reason for Admission: onset of labor Prenatal Procedures: ultrasound Intrapartum Procedures: GBS prophylaxis and VBAC epidural Postpartum Procedures: Rho(D) Ig Complications-Operative and Postpartum: none    Hemoglobin  Date Value Range Status  10/13/2011 9.2* 12.0-15.0 (g/dL) Final     DELTA CHECK NOTED     REPEATED TO VERIFY     HCT  Date Value Range Status  10/13/2011 29.9* 36.0-46.0 (%) Final    Hospital Course:  Hospital Course: Admitted in labor . pos GBS. Progressed to fully dilated, labored down for 2 hours. Delivery was performed by V.Emilee Hero, CNM,  without difficulty. Patient and baby tolerated the procedure without difficulty, with a 2nd laceration noted. Infant to FTN. Mother and infant then had an uncomplicated postpartum course, with initial Breast feeding, then switched to bottle. Mom's physical exam was WNL, and she was discharged home in stable condition. Contraception plan was condoms.  She received adequate benefit from po pain medications.  Discharge Diagnoses: term successful VBAC  Discharge Information: Date: 10/18/2011 Activity: pelvic rest Diet: routine Medications:  Medication List  As of 10/18/2011 10:07 PM   START taking these medications         ibuprofen 600 MG tablet   Commonly known as: ADVIL,MOTRIN   Take 1 tablet (600 mg total) by mouth every 6 (six) hours.      oxyCODONE-acetaminophen 5-325 MG per tablet   Commonly known as: PERCOCET   Take 1 tablet by mouth every 4 (four) hours as needed (moderate - severe pain).         CONTINUE taking these medications         levothyroxine 125 MCG tablet   Commonly known as: SYNTHROID, LEVOTHROID      prenatal vitamin w/FE, FA 27-1 MG Tabs      ranitidine 150 MG tablet   Commonly known as: ZANTAC          Where to get your medications    These are the prescriptions that you need to pick up.   You may get these medications from any pharmacy.         ibuprofen  600 MG tablet   oxyCODONE-acetaminophen 5-325 MG per tablet           Condition: stable Instructions: refer to practice specific booklet Discharge to: home Follow-up Information    Follow up with Ezinne Yogi M, CNM in 6 weeks. (If symptoms worsen)    Contact information:   [address] [place] [address] [phone]          Newborn Data: Live born  Information for the patient's newborn:  Savon, Cobbs [[id number]]  female ; APGAR 6, 8 ; weight ; 8#3oz  Home with mother.  Johanna Matto M 10/18/2011, 10:07 PM

## 2011-10-18 NOTE — Progress Notes (Signed)
Adult Lactation Consultation Outpatient Visit Note  Patient Name: Renee Thompson Date of Birth: [birth year] Gestational Age at Delivery: [redacted]w[redacted]d Type of Delivery:   Breastfeeding History: Frequency of Breastfeeding:  Length of Feeding:  Voids:  Stools:   Supplementing / Method: Pumping:  Type of Pump:   Frequency:  Volume:    Comments:    Consultation Evaluation:  Initial Feeding Assessment: Pre-feed Weight: Post-feed Weight: Amount Transferred: Comments:  Additional Feeding Assessment: Pre-feed Weight: Post-feed Weight: Amount Transferred: Comments:  Additional Feeding Assessment: Pre-feed Weight: Post-feed Weight: Amount Transferred: Comments:  Total Breast milk Transferred this Visit:  Total Supplement Given:   Additional Interventions:   Follow-Up      Stevan Born [facility] 10/18/2011, 1:04 PM

## 2011-10-24 ENCOUNTER — Encounter (HOSPITAL_COMMUNITY): Payer: BC Managed Care – PPO

## 2011-11-14 ENCOUNTER — Encounter (HOSPITAL_COMMUNITY)
Admission: RE | Admit: 2011-11-14 | Discharge: 2011-11-14 | Disposition: A | Discharge status: Home / Self Care | Payer: BC Managed Care – PPO | Source: Ambulatory Visit | Attending: Obstetrics and Gynecology | Admitting: Obstetrics and Gynecology

## 2011-11-14 DIAGNOSIS — O923 Agalactia: Secondary | ICD-10-CM | POA: Insufficient documentation

## 2011-12-15 ENCOUNTER — Encounter (HOSPITAL_COMMUNITY)
Admission: RE | Admit: 2011-12-15 | Discharge: 2011-12-15 | Disposition: A | Discharge status: Home / Self Care | Payer: BC Managed Care – PPO | Source: Ambulatory Visit | Attending: Obstetrics and Gynecology | Admitting: Obstetrics and Gynecology

## 2011-12-15 DIAGNOSIS — O923 Agalactia: Secondary | ICD-10-CM | POA: Insufficient documentation

## 2012-02-12 ENCOUNTER — Encounter (HOSPITAL_COMMUNITY)
Admission: RE | Admit: 2012-02-12 | Discharge: 2012-02-12 | Disposition: A | Discharge status: Home / Self Care | Payer: BC Managed Care – PPO | Source: Ambulatory Visit | Attending: Obstetrics and Gynecology | Admitting: Obstetrics and Gynecology

## 2012-02-12 DIAGNOSIS — O923 Agalactia: Secondary | ICD-10-CM | POA: Insufficient documentation

## 2012-03-14 ENCOUNTER — Encounter (HOSPITAL_COMMUNITY)
Admission: RE | Admit: 2012-03-14 | Discharge: 2012-03-14 | Disposition: A | Discharge status: Home / Self Care | Payer: BC Managed Care – PPO | Source: Ambulatory Visit | Attending: Obstetrics and Gynecology | Admitting: Obstetrics and Gynecology

## 2012-03-14 DIAGNOSIS — O923 Agalactia: Secondary | ICD-10-CM | POA: Insufficient documentation

## 2012-04-14 ENCOUNTER — Encounter (HOSPITAL_COMMUNITY)
Admission: RE | Admit: 2012-04-14 | Discharge: 2012-04-14 | Disposition: A | Discharge status: Home / Self Care | Payer: BC Managed Care – PPO | Source: Ambulatory Visit | Attending: Obstetrics and Gynecology | Admitting: Obstetrics and Gynecology

## 2012-04-14 DIAGNOSIS — O923 Agalactia: Secondary | ICD-10-CM | POA: Insufficient documentation

## 2012-05-15 ENCOUNTER — Encounter (HOSPITAL_COMMUNITY)
Admission: RE | Admit: 2012-05-15 | Discharge: 2012-05-15 | Disposition: A | Discharge status: Home / Self Care | Payer: BC Managed Care – PPO | Source: Ambulatory Visit | Attending: Obstetrics and Gynecology | Admitting: Obstetrics and Gynecology

## 2012-05-15 DIAGNOSIS — O923 Agalactia: Secondary | ICD-10-CM | POA: Insufficient documentation

## 2012-05-21 ENCOUNTER — Ambulatory Visit
Admission: RE | Admit: 2012-05-21 | Discharge: 2012-05-21 | Disposition: A | Discharge status: Home / Self Care | Payer: BC Managed Care – PPO | Source: Ambulatory Visit | Attending: Endocrinology | Admitting: Endocrinology

## 2012-05-21 DIAGNOSIS — E049 Nontoxic goiter, unspecified: Secondary | ICD-10-CM

## 2012-06-04 ENCOUNTER — Other Ambulatory Visit: Payer: Self-pay | Admitting: Endocrinology

## 2012-06-04 DIAGNOSIS — E041 Nontoxic single thyroid nodule: Secondary | ICD-10-CM

## 2012-06-15 ENCOUNTER — Encounter (HOSPITAL_COMMUNITY)
Admission: RE | Admit: 2012-06-15 | Discharge: 2012-06-15 | Disposition: A | Discharge status: Home / Self Care | Payer: BC Managed Care – PPO | Source: Ambulatory Visit | Attending: Obstetrics and Gynecology | Admitting: Obstetrics and Gynecology

## 2012-06-15 DIAGNOSIS — O923 Agalactia: Secondary | ICD-10-CM | POA: Insufficient documentation

## 2012-07-17 ENCOUNTER — Other Ambulatory Visit (HOSPITAL_COMMUNITY)
Admission: RE | Admit: 2012-07-17 | Discharge: 2012-07-17 | Disposition: A | Discharge status: Home / Self Care | Payer: Self-pay | Source: Ambulatory Visit | Attending: Interventional Radiology | Admitting: Interventional Radiology

## 2012-07-17 ENCOUNTER — Encounter (HOSPITAL_COMMUNITY)
Admission: RE | Admit: 2012-07-17 | Discharge: 2012-07-17 | Disposition: A | Discharge status: Home / Self Care | Payer: BC Managed Care – PPO | Source: Ambulatory Visit | Attending: Obstetrics and Gynecology | Admitting: Obstetrics and Gynecology

## 2012-07-17 ENCOUNTER — Ambulatory Visit
Admission: RE | Admit: 2012-07-17 | Discharge: 2012-07-17 | Disposition: A | Discharge status: Home / Self Care | Payer: BC Managed Care – PPO | Source: Ambulatory Visit | Attending: Endocrinology | Admitting: Endocrinology

## 2012-07-17 DIAGNOSIS — E041 Nontoxic single thyroid nodule: Secondary | ICD-10-CM

## 2012-07-17 DIAGNOSIS — O923 Agalactia: Secondary | ICD-10-CM | POA: Insufficient documentation

## 2012-07-17 DIAGNOSIS — E049 Nontoxic goiter, unspecified: Secondary | ICD-10-CM | POA: Insufficient documentation

## 2012-07-20 HISTORY — PX: CHOLECYSTECTOMY: SHX55

## 2012-07-24 ENCOUNTER — Other Ambulatory Visit: Payer: BC Managed Care – PPO

## 2012-08-17 ENCOUNTER — Encounter (HOSPITAL_COMMUNITY)
Admission: RE | Admit: 2012-08-17 | Discharge: 2012-08-17 | Disposition: A | Discharge status: Home / Self Care | Payer: BC Managed Care – PPO | Source: Ambulatory Visit | Attending: Obstetrics and Gynecology | Admitting: Obstetrics and Gynecology

## 2012-08-17 DIAGNOSIS — O923 Agalactia: Secondary | ICD-10-CM | POA: Insufficient documentation

## 2012-09-05 DIAGNOSIS — K219 Gastro-esophageal reflux disease without esophagitis: Secondary | ICD-10-CM

## 2012-09-05 DIAGNOSIS — E041 Nontoxic single thyroid nodule: Secondary | ICD-10-CM | POA: Insufficient documentation

## 2012-09-05 HISTORY — DX: Gastro-esophageal reflux disease without esophagitis: K21.9

## 2013-05-15 ENCOUNTER — Other Ambulatory Visit: Payer: Self-pay | Admitting: Endocrinology

## 2013-05-15 DIAGNOSIS — E049 Nontoxic goiter, unspecified: Secondary | ICD-10-CM

## 2013-07-22 ENCOUNTER — Ambulatory Visit
Admission: RE | Admit: 2013-07-22 | Discharge: 2013-07-22 | Disposition: A | Discharge status: Home / Self Care | Payer: BC Managed Care – PPO | Source: Ambulatory Visit | Attending: Endocrinology | Admitting: Endocrinology

## 2013-07-22 DIAGNOSIS — E049 Nontoxic goiter, unspecified: Secondary | ICD-10-CM

## 2014-05-12 ENCOUNTER — Other Ambulatory Visit: Payer: Self-pay | Admitting: Endocrinology

## 2014-05-12 DIAGNOSIS — E041 Nontoxic single thyroid nodule: Secondary | ICD-10-CM

## 2014-05-19 ENCOUNTER — Ambulatory Visit
Admission: RE | Admit: 2014-05-19 | Discharge: 2014-05-19 | Disposition: A | Discharge status: Home / Self Care | Payer: BC Managed Care – PPO | Source: Ambulatory Visit | Attending: Endocrinology | Admitting: Endocrinology

## 2014-05-19 DIAGNOSIS — E041 Nontoxic single thyroid nodule: Secondary | ICD-10-CM

## 2014-08-14 IMAGING — US US SOFT TISSUE HEAD/NECK
1 series · 13 of 25 positions shown · non-contrast
Comparison: 06/22/2013

CLINICAL DATA: Thyroid nodule

EXAM:
THYROID ULTRASOUND
TECHNIQUE: Ultrasound examination of the thyroid gland and adjacent soft
tissues was performed.

[Series 1: us soft tissue head/neck · 0.06mm/px · 13 of 52 slices shown]
[im 1/52]
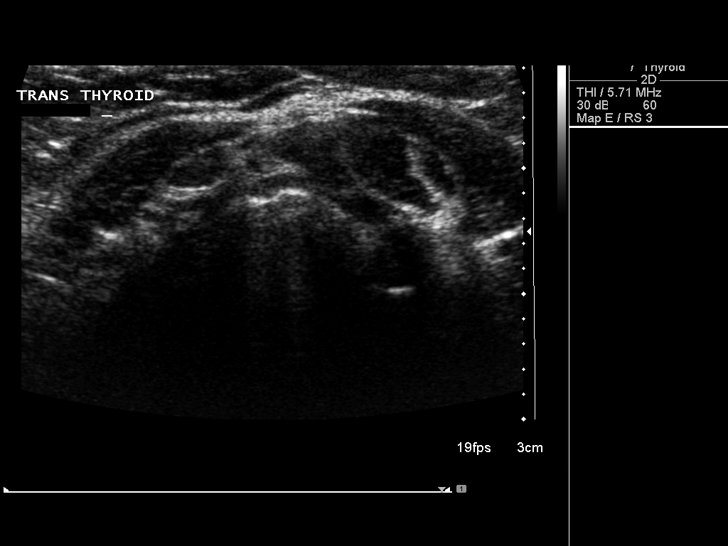
[im 5/52]
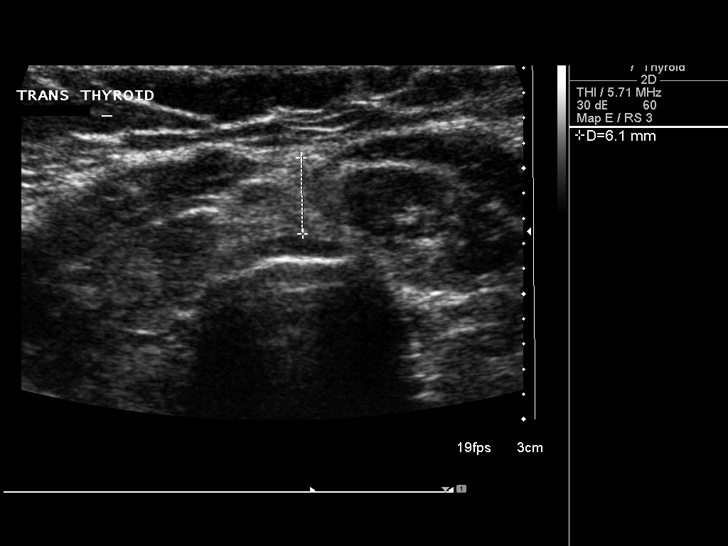
[im 9/52]
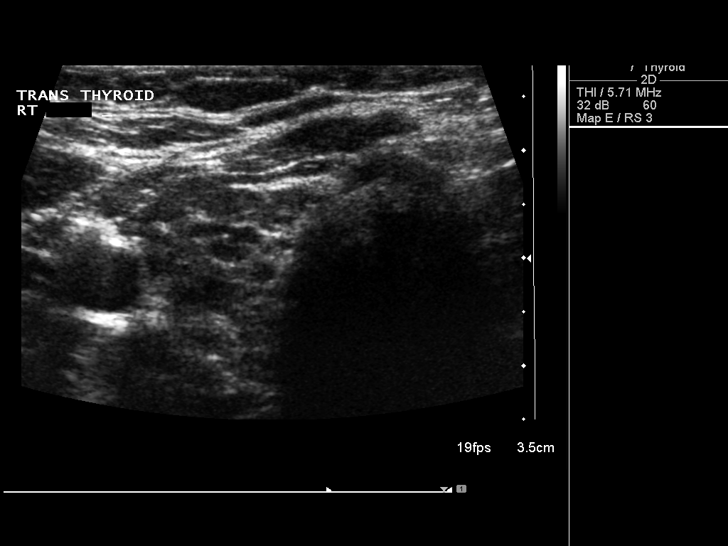
[im 13/52]
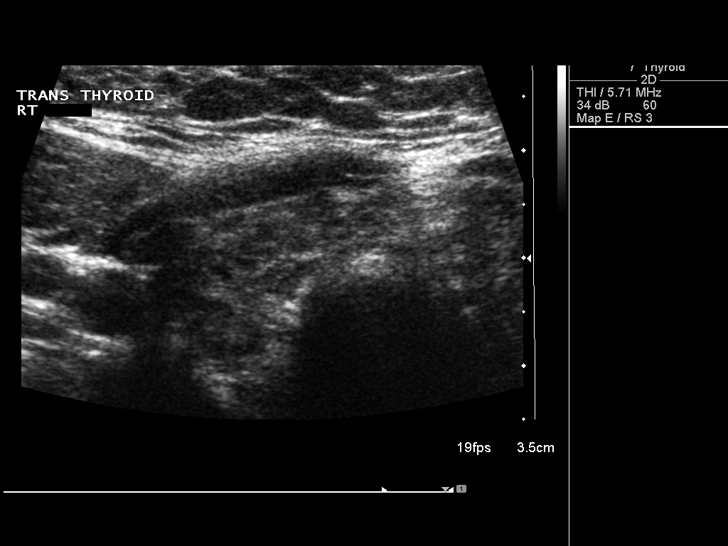
[im 18/52]
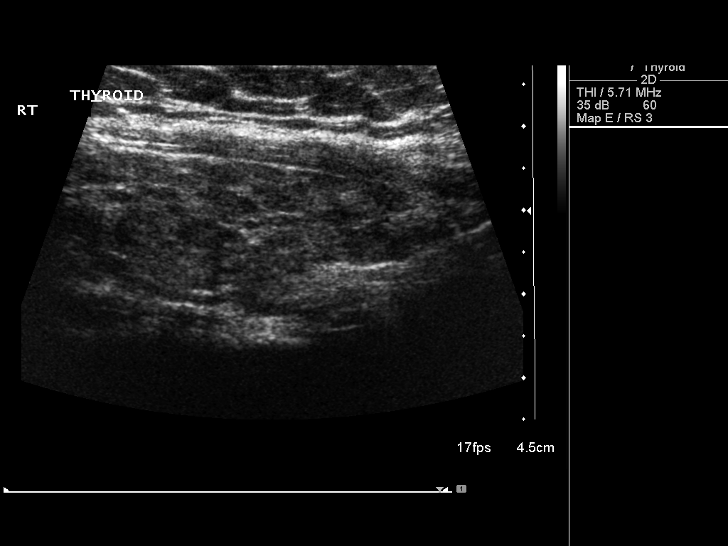
[im 22/52]
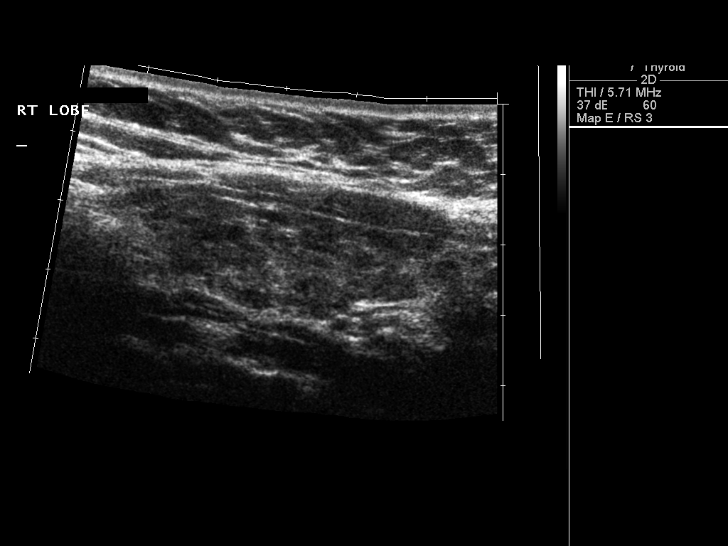
[im 26/52]
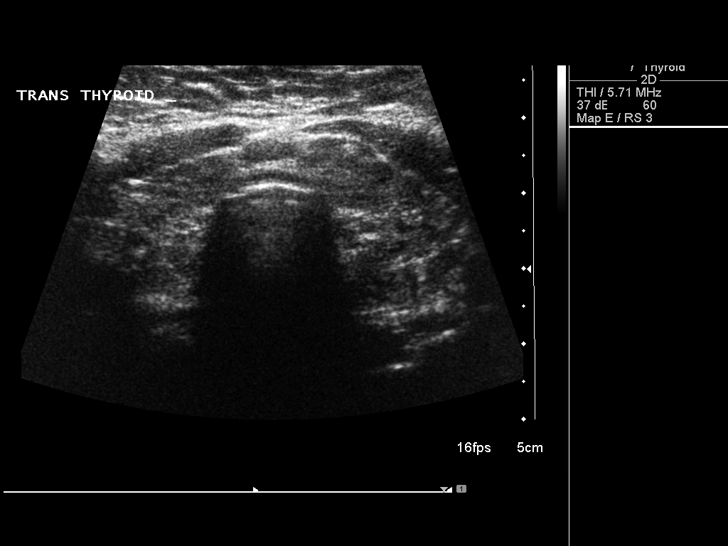
[im 30/52]
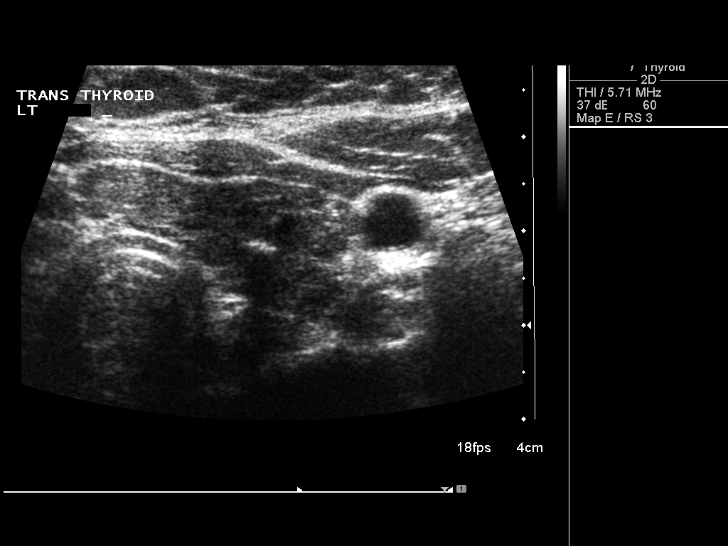
[im 35/52]
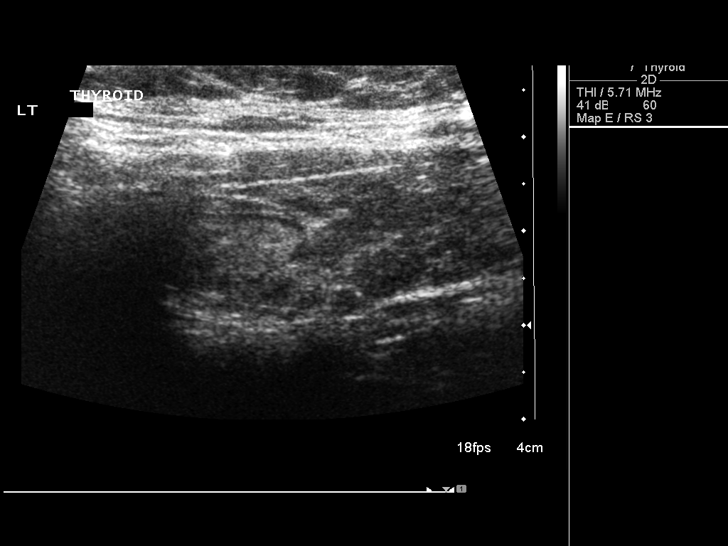
[im 39/52]
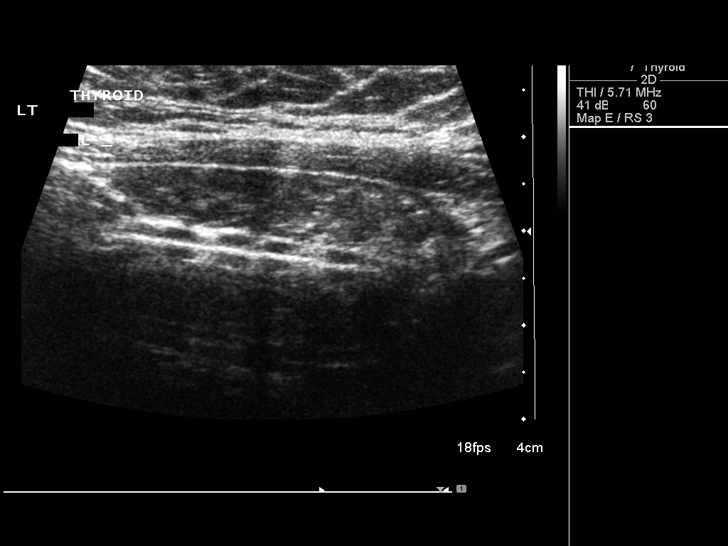
[im 43/52]
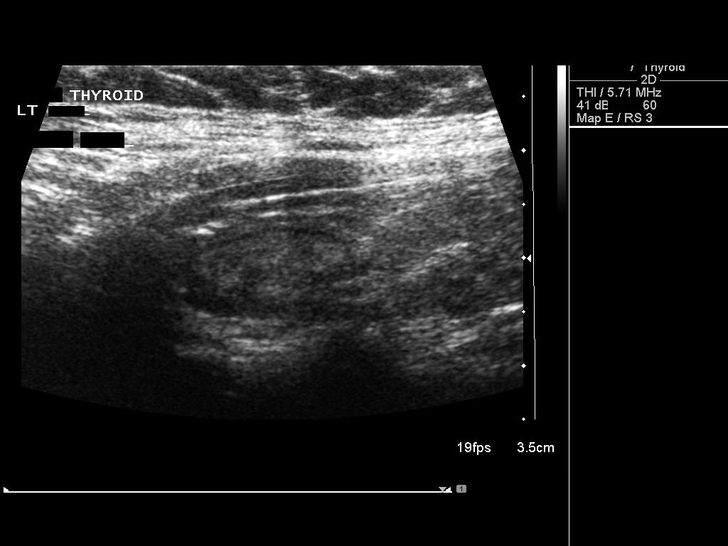
[im 47/52]
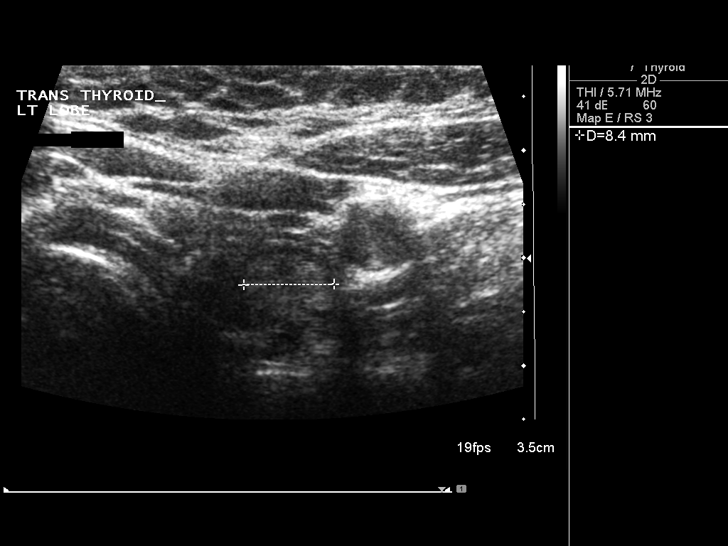
[im 52/52]
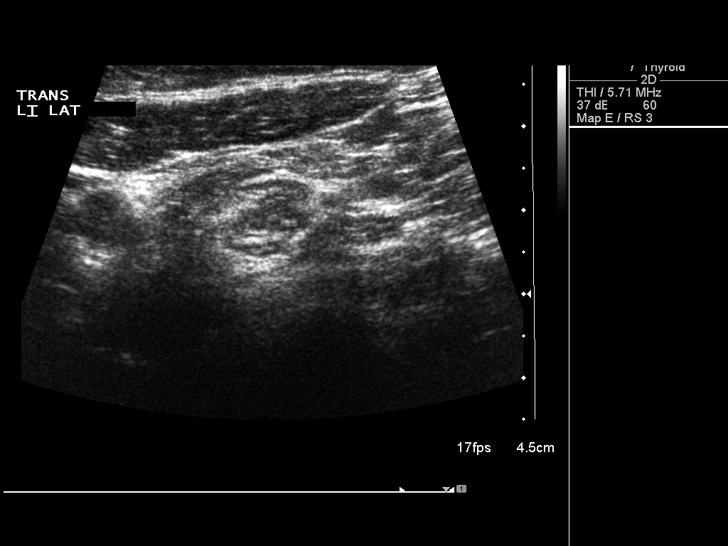

[13 of 25 positions shown; findings below may reference images not displayed]

FINDINGS: Right thyroid lobe

Measurements: 6.0 x 1.7 x 1.7 cm..  No nodules visualized.

Left thyroid lobe

Measurements: 5.5 x 1.4 x 1.9 cm.. In the left lobe of the thyroid
there is a mildly complex lesion measuring 1.4 cm. This is
relatively stable when compared with the prior exam. This by history
has been biopsy. The coarse calcification is noted in the left lobe.

Isthmus

Thickness: 6 mm..  Stable small nodule is noted.

Lymphadenopathy

None visualized.
IMPRESSION: Previously biopsied dominant nodule within the upper pole of the
left lobe of the thyroid.

Findings do not meet current SRU consensus criteria for biopsy.
Follow-up by clinical exam is recommended. If patient has known risk
factors for thyroid carcinoma, consider follow-up ultrasound in 12
months. If patient is clinically hyperthyroid, consider nuclear
medicine thyroid uptake and scan.

Reference: Management of Thyroid Nodules Detected at US: Society of
Radiologists in Ultrasound Consensus Conference Statement. Radiology

## 2014-09-15 ENCOUNTER — Encounter (HOSPITAL_COMMUNITY): Payer: Self-pay | Admitting: Obstetrics and Gynecology

## 2015-03-26 DIAGNOSIS — E039 Hypothyroidism, unspecified: Secondary | ICD-10-CM | POA: Insufficient documentation

## 2015-07-01 ENCOUNTER — Other Ambulatory Visit: Payer: Self-pay | Admitting: Endocrinology

## 2015-07-01 DIAGNOSIS — E049 Nontoxic goiter, unspecified: Secondary | ICD-10-CM

## 2016-06-27 ENCOUNTER — Ambulatory Visit
Admission: RE | Admit: 2016-06-27 | Discharge: 2016-06-27 | Disposition: A | Discharge status: Home / Self Care | Payer: 59 | Source: Ambulatory Visit | Attending: Endocrinology | Admitting: Endocrinology

## 2016-06-27 ENCOUNTER — Encounter: Payer: Self-pay | Admitting: *Deleted

## 2016-06-27 DIAGNOSIS — E049 Nontoxic goiter, unspecified: Secondary | ICD-10-CM

## 2016-09-22 IMAGING — US US SOFT TISSUE HEAD/NECK
1 series · 14 of 25 positions shown · non-contrast
Comparison: 05/19/2014 and previous

CLINICAL DATA: Goiter.  FNA biopsy of left upper nodule 07/17/2012.

EXAM:
THYROID ULTRASOUND
TECHNIQUE: Ultrasound examination of the thyroid gland and adjacent soft
tissues was performed.

[Series 1: us soft tissue head/neck · 0.07mm/px · 14 of 45 slices shown]
[im 1/45]
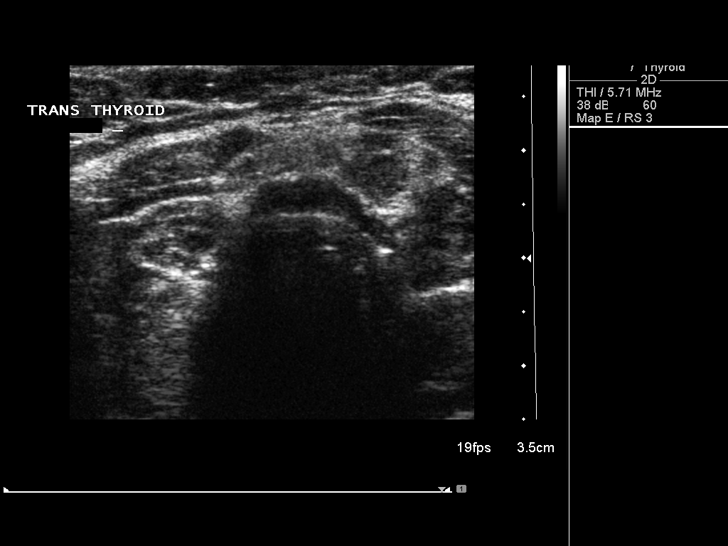
[im 4/45]
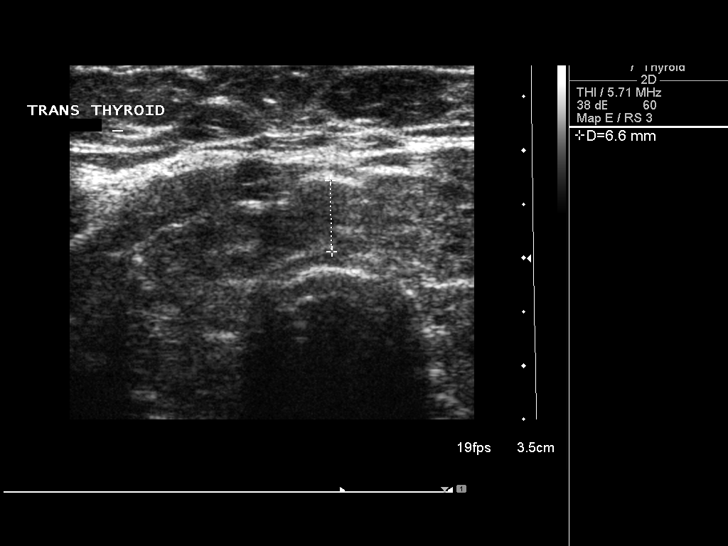
[im 8/45]
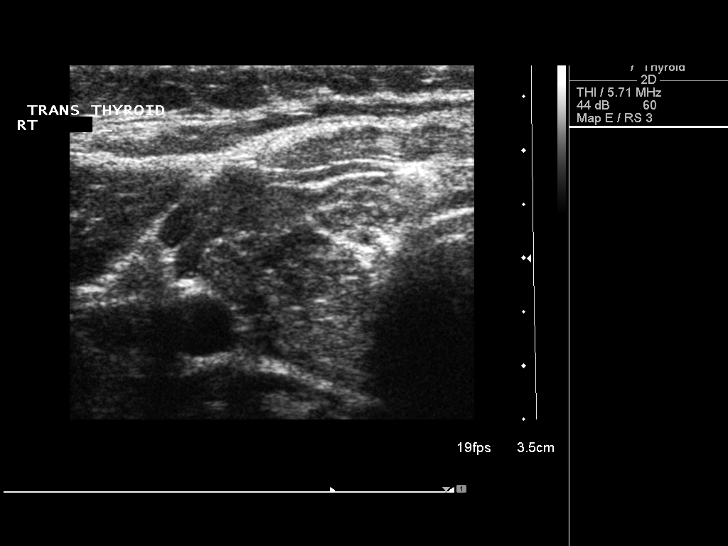
[im 12/45]
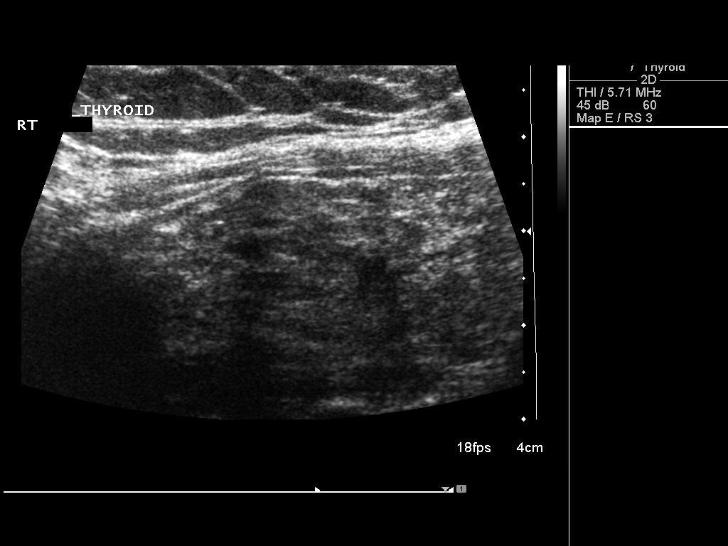
[im 15/45]
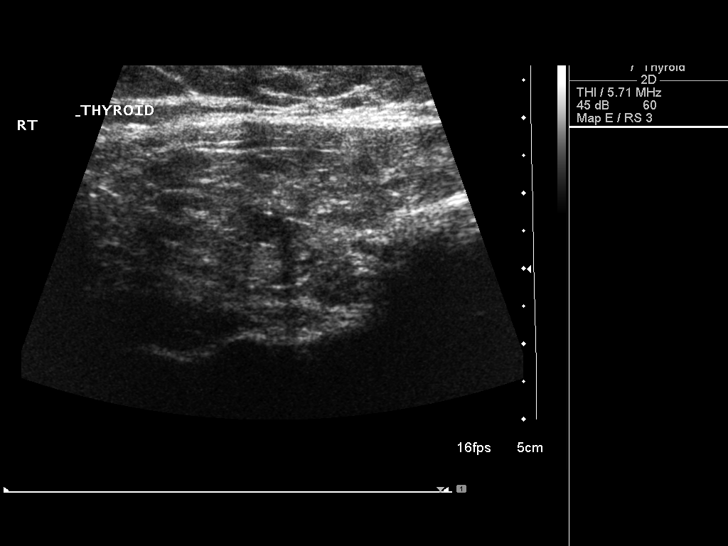
[im 17/45]
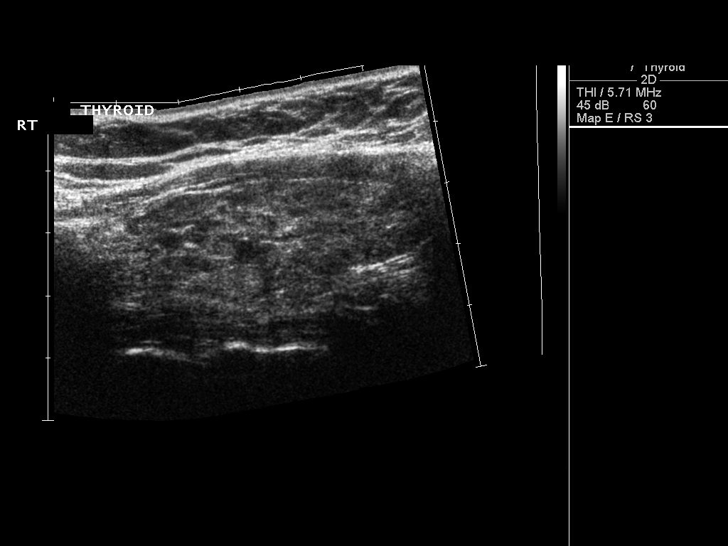
[im 21/45]
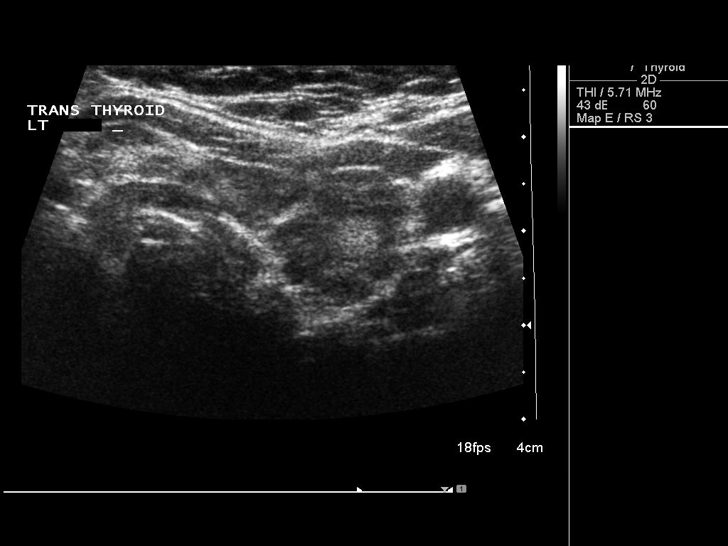
[im 24/45]
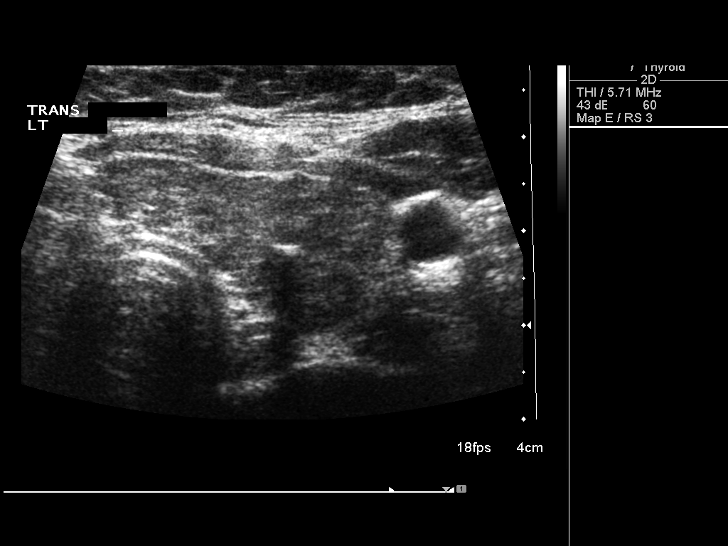
[im 28/45]
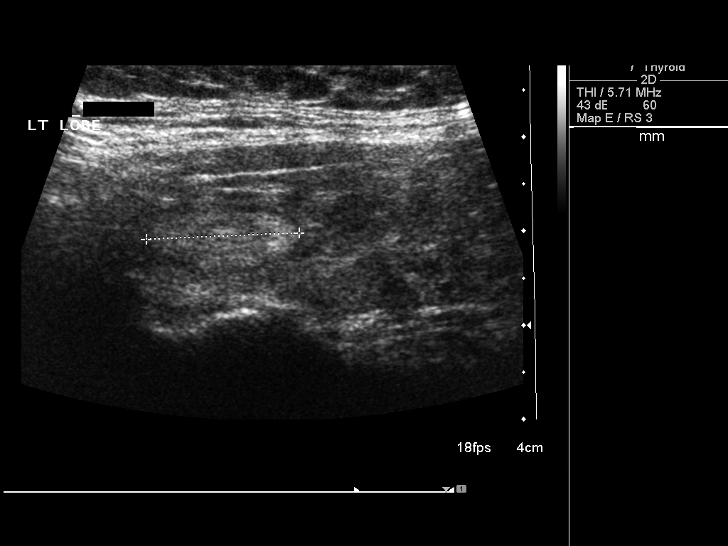
[im 30/45]
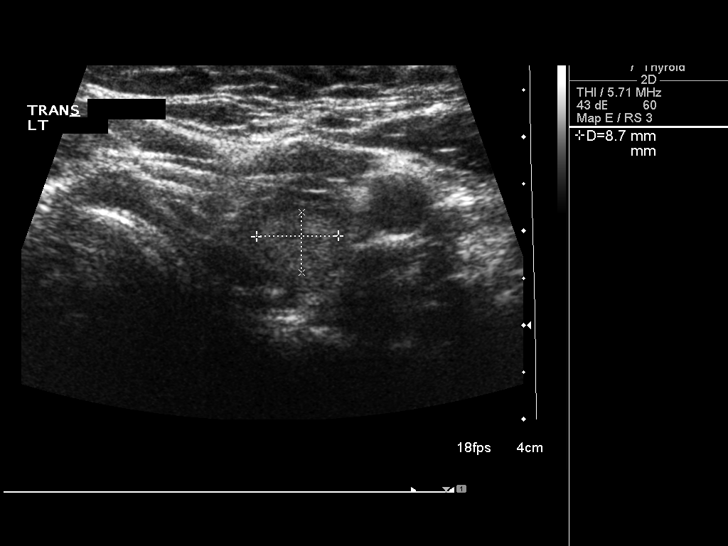
[im 34/45]
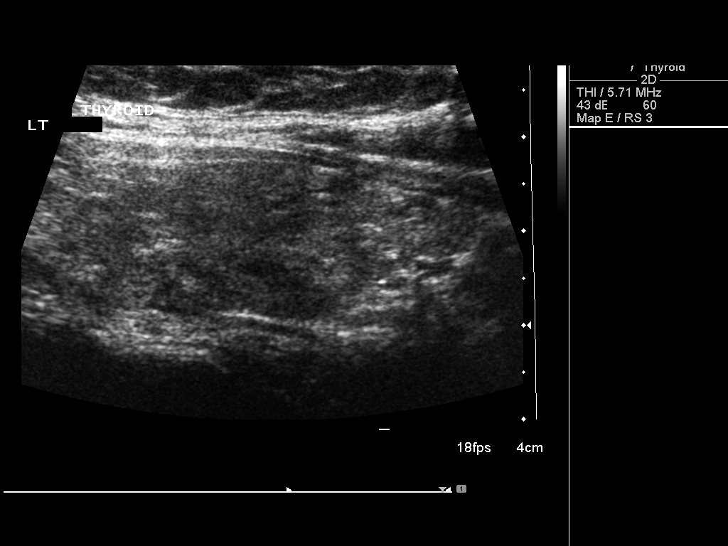
[im 37/45]
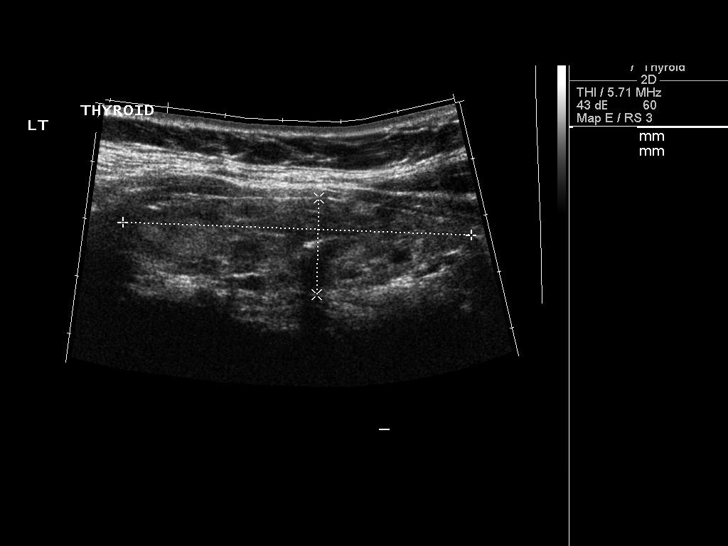
[im 41/45]
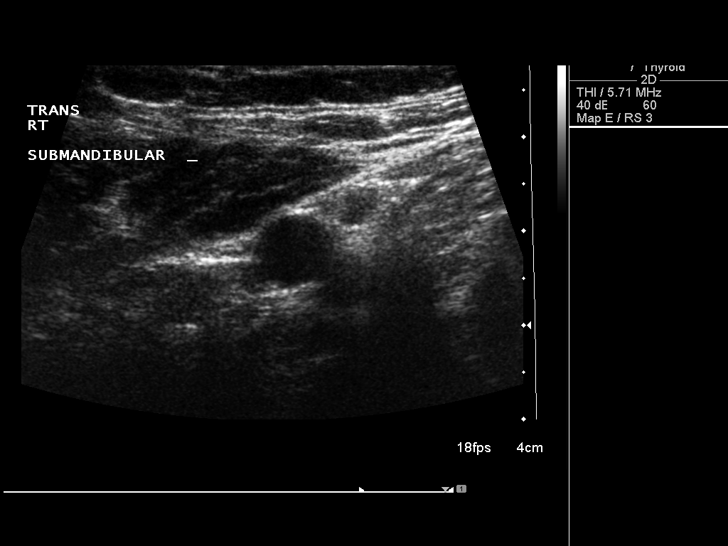
[im 45/45]
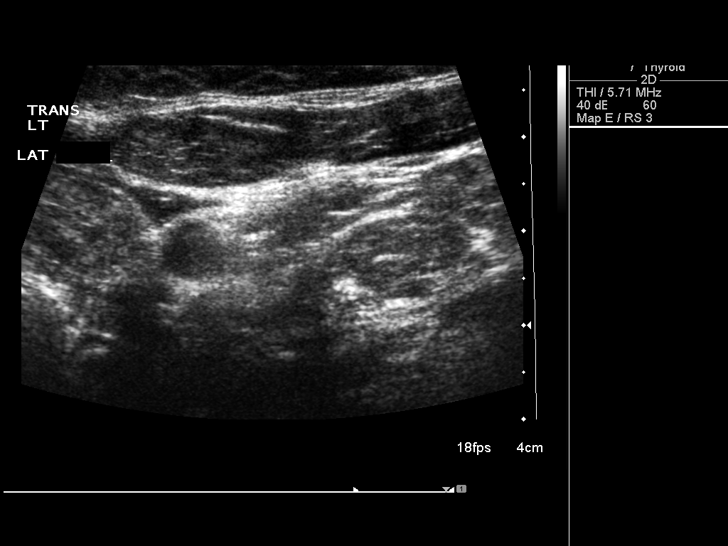

[14 of 25 positions shown; findings below may reference images not displayed]

FINDINGS: Right thyroid lobe

Measurements: 5.8 x 2.2 x 1.7 cm. Heterogeneous echotexture without
discrete lesion.

Left thyroid lobe

Measurements: 6 x 1.7 x 2.2 cm. Heterogeneous echotexture. 1.6 x
x 0.9 cm slightly echogenic solid nodule, superior pole (previously
1.4 x 0.6 x 0.8). Focal coarse calcification in the mid lobe let
less than 3 mm as before.

Isthmus

Thickness: 0.7 cm.  No nodules visualized.

Lymphadenopathy

None visualized.
IMPRESSION: 1. Thyromegaly with little change in left upper nodule. Correlate
with previous biopsy results.

## 2017-06-02 ENCOUNTER — Other Ambulatory Visit: Payer: Self-pay | Admitting: Endocrinology

## 2017-06-02 DIAGNOSIS — E049 Nontoxic goiter, unspecified: Secondary | ICD-10-CM

## 2017-07-18 ENCOUNTER — Ambulatory Visit
Admission: RE | Admit: 2017-07-18 | Discharge: 2017-07-18 | Disposition: A | Discharge status: Home / Self Care | Payer: 59 | Source: Ambulatory Visit | Attending: Endocrinology | Admitting: Endocrinology

## 2017-07-18 DIAGNOSIS — E049 Nontoxic goiter, unspecified: Secondary | ICD-10-CM

## 2017-07-18 DIAGNOSIS — E041 Nontoxic single thyroid nodule: Secondary | ICD-10-CM | POA: Diagnosis not present

## 2017-07-18 DIAGNOSIS — E039 Hypothyroidism, unspecified: Secondary | ICD-10-CM | POA: Diagnosis not present

## 2017-07-25 DIAGNOSIS — E039 Hypothyroidism, unspecified: Secondary | ICD-10-CM | POA: Diagnosis not present

## 2017-07-25 DIAGNOSIS — E049 Nontoxic goiter, unspecified: Secondary | ICD-10-CM | POA: Diagnosis not present

## 2017-09-26 DIAGNOSIS — E049 Nontoxic goiter, unspecified: Secondary | ICD-10-CM | POA: Diagnosis not present

## 2017-10-27 ENCOUNTER — Encounter: Payer: Self-pay | Admitting: *Deleted

## 2017-10-27 ENCOUNTER — Other Ambulatory Visit: Payer: Self-pay | Admitting: *Deleted

## 2017-10-31 ENCOUNTER — Other Ambulatory Visit (HOSPITAL_COMMUNITY)
Admission: RE | Admit: 2017-10-31 | Discharge: 2017-10-31 | Disposition: A | Discharge status: Home / Self Care | Payer: 59 | Source: Ambulatory Visit | Attending: Family Medicine | Admitting: Family Medicine

## 2017-10-31 ENCOUNTER — Encounter: Payer: Self-pay | Admitting: Family Medicine

## 2017-10-31 ENCOUNTER — Ambulatory Visit (INDEPENDENT_AMBULATORY_CARE_PROVIDER_SITE_OTHER): Payer: 59 | Admitting: Family Medicine

## 2017-10-31 VITALS — BP 116/70 | HR 71 | Temp 98.1°F | Ht 68.5 in | Wt 273.5 lb

## 2017-10-31 DIAGNOSIS — Z124 Encounter for screening for malignant neoplasm of cervix: Secondary | ICD-10-CM

## 2017-10-31 DIAGNOSIS — E039 Hypothyroidism, unspecified: Secondary | ICD-10-CM

## 2017-10-31 DIAGNOSIS — Z0000 Encounter for general adult medical examination without abnormal findings: Secondary | ICD-10-CM | POA: Diagnosis not present

## 2017-10-31 DIAGNOSIS — Z23 Encounter for immunization: Secondary | ICD-10-CM

## 2017-10-31 LAB — LIPID PANEL
Cholesterol: 168 mg/dL (ref 0–200)
HDL: 49.4 mg/dL (ref >39.00)
LDL Cholesterol: 97 mg/dL (ref 0–99)
NonHDL: 118.74
TRIGLYCERIDES: 108 mg/dL (ref 0.0–149.0)
Total CHOL/HDL Ratio: 3
VLDL: 21.6 mg/dL (ref 0.0–40.0)

## 2017-10-31 LAB — COMPREHENSIVE METABOLIC PANEL
ALBUMIN: 4.2 g/dL (ref 3.5–5.2)
ALK PHOS: 92 U/L (ref 39–117)
ALT: 11 U/L (ref 0–35)
AST: 13 U/L (ref 0–37)
BILIRUBIN TOTAL: 0.4 mg/dL (ref 0.2–1.2)
BUN: 12 mg/dL (ref 6–23)
CALCIUM: 8.9 mg/dL (ref 8.4–10.5)
CO2: 28 mEq/L (ref 19–32)
Chloride: 104 mEq/L (ref 96–112)
Creatinine, Ser: 0.68 mg/dL (ref 0.40–1.20)
GFR: 103 mL/min (ref >60.00)
Glucose, Bld: 79 mg/dL (ref 70–99)
Potassium: 4.3 mEq/L (ref 3.5–5.1)
Sodium: 139 mEq/L (ref 135–145)
TOTAL PROTEIN: 7.2 g/dL (ref 6.0–8.3)

## 2017-10-31 LAB — CBC WITH DIFFERENTIAL/PLATELET
BASOS PCT: 0.4 % (ref 0.0–3.0)
Basophils Absolute: 0 10*3/uL (ref 0.0–0.1)
EOS ABS: 0.3 10*3/uL (ref 0.0–0.7)
EOS PCT: 3 % (ref 0.0–5.0)
HEMATOCRIT: 40.8 % (ref 36.0–46.0)
Hemoglobin: 13.1 g/dL (ref 12.0–15.0)
LYMPHS PCT: 33.1 % (ref 12.0–46.0)
Lymphs Abs: 3 10*3/uL (ref 0.7–4.0)
MCHC: 32.2 g/dL (ref 30.0–36.0)
MCV: 77.4 fl — ABNORMAL LOW (ref 78.0–100.0)
Monocytes Absolute: 0.4 10*3/uL (ref 0.1–1.0)
Monocytes Relative: 4.5 % (ref 3.0–12.0)
NEUTROS ABS: 5.4 10*3/uL (ref 1.4–7.7)
Neutrophils Relative %: 59 % (ref 43.0–77.0)
PLATELETS: 311 10*3/uL (ref 150.0–400.0)
RBC: 5.27 Mil/uL — ABNORMAL HIGH (ref 3.87–5.11)
RDW: 16.2 % — AB (ref 11.5–15.5)
WBC: 9.1 10*3/uL (ref 4.0–10.5)

## 2017-10-31 LAB — TSH: TSH: 0.84 u[IU]/mL (ref 0.35–4.50)

## 2017-10-31 NOTE — Patient Instructions (Addendum)
It was so good seeing you again! Thank you for establishing with my new practice and allowing me to continue caring for you. It means a lot to me.   Please schedule a follow up appointment with me in 1 year for your physical.   Please do these things to maintain good health!   Exercise at least 30-45 minutes a day,  4-5 days a week.   Eat a low-fat diet with lots of fruits and vegetables, up to 7-9 servings per day.  Drink plenty of water daily. Try to drink 8 8oz glasses per day.  Seatbelts can save your life. Always wear your seatbelt.  Place Smoke Detectors on every level of your home and check batteries every year.  Schedule an appointment with an eye doctor for an eye exam every 1-2 years  Safe sex - use condoms to protect yourself from STDs if you could be exposed to these types of infections. Use birth control if you do not want to become pregnant and are sexually active.  Avoid heavy alcohol use. If you drink, keep it to less than 2 drinks/day and not every day.  [place].  Choose someone you trust that could speak for you if you became unable to speak for yourself.  Depression is common in our stressful world.If you're feeling down or losing interest in things you normally enjoy, please come in for a visit.  If anyone is threatening or hurting you, please get help. Physical or Emotional Violence is never OK.   Temporomandibular Joint Syndrome Temporomandibular joint (TMJ) syndrome is a condition that affects the joints between your jaw and your skull. The TMJs are located near your ears and allow your jaw to open and close. These joints and the nearby muscles are involved in all movements of the jaw. People with TMJ syndrome have pain in the area of these joints and muscles. Chewing, biting, or other movements of the jaw can be difficult or painful. TMJ syndrome can be caused by various things. In many cases, the condition is mild and goes away within a  few weeks. For some people, the condition can become a long-term problem. What are the causes? Possible causes of TMJ syndrome include:  Grinding your teeth or clenching your jaw. Some people do this when they are under stress.  Arthritis.  Injury to the jaw.  Head or neck injury.  Teeth or dentures that are not aligned well.  In some cases, the cause of TMJ syndrome may not be known. What are the signs or symptoms? The most common symptom is an aching pain on the side of the head in the area of the TMJ. Other symptoms may include:  Pain when moving your jaw, such as when chewing or biting.  Being unable to open your jaw all the way.  Making a clicking sound when you open your mouth.  Headache.  Earache.  Neck or shoulder pain.  How is this diagnosed? Diagnosis can usually be made based on your symptoms, your medical history, and a physical exam. Your health care provider may check the range of motion of your jaw. Imaging tests, such as X-rays or an MRI, are sometimes done. You may need to see your dentist to determine if your teeth and jaw are lined up correctly. How is this treated? TMJ syndrome often goes away on its own. If treatment is needed, the options may include:  Eating soft foods and applying ice or heat.  Medicines  to relieve pain or inflammation.  Medicines to relax the muscles.  A splint, bite plate, or mouthpiece to prevent teeth grinding or jaw clenching.  Relaxation techniques or counseling to help reduce stress.  Transcutaneous electrical nerve stimulation (TENS). This helps to relieve pain by applying an electrical current through the skin.  Acupuncture. This is sometimes helpful to relieve pain.  Jaw surgery. This is rarely needed.  Follow these instructions at home:  Take medicines only as directed by your health care provider.  Eat a soft diet if you are having trouble chewing.  Apply ice to the painful area. ? Put ice in a plastic  bag. ? Place a towel between your skin and the bag. ? Leave the ice on for 20 minutes, 2-3 times a day.  Apply a warm compress to the painful area as directed.  Massage your jaw area and perform any jaw stretching exercises as recommended by your health care provider.  If you were given a mouthpiece or bite plate, wear it as directed.  Avoid foods that require a lot of chewing. Do not chew gum.  Keep all follow-up visits as directed by your health care provider. This is important. Contact a health care provider if:  You are having trouble eating.  You have new or worsening symptoms. Get help right away if:  Your jaw locks open or closed. This information is not intended to replace advice given to you by your health care provider. Make sure you discuss any questions you have with your health care provider. Document Released: 07/26/2001 Document Revised: 06/30/2016 Document Reviewed: 06/05/2014 Elsevier Interactive Patient Education  [employer].

## 2017-10-31 NOTE — Progress Notes (Signed)
Subjective  Chief Complaint  Patient presents with  . Establish Care    Transfer from [place]  . Annual Exam    HPI: Renee Thompson is a 36 y.o. female who presents to [place] [facility] today for a Female Wellness Visit. She is a former [facility] patient and is here to reestablish care with me today.   Wellness Visit: annual visit with health maintenance review and exam with Pap   Doing well! Active, new job with [employer] and loving it. Has 2 small concerns:  Cold hands and feet w/o pain or changes in color.  She tends to run hot and is typically hot and sweating, so she finds the cold hands and feet unusual.  No numbness tingling or weakness.  Bilateral TMJ pain that has been worse over the last 2 weeks.  In general she does well with occasional popping when she opens or closes her mouth.  However the last 2 weeks she is experience soreness.  She does admit to bruxism.  This is mostly at night and has been worse over the last 2 weeks due to increase work stress.  She is not a gum chewer.  No headaches.  No sinus symptoms.   PCOS follow-up: She does well and does not take any medications for this.  We discussed her obesity and lifestyle and recommend changes diet and exercise program getting strengthening.  Discussed insulin resistance and associated increased risk for diabetes with this diagnosis.  She is not using birth control except for condoms.  She is having regular periods. Lifestyle: Body mass index is 40.98 kg/m. Wt Readings from Last 3 Encounters:  10/31/17 273 lb 8 oz (124.1 kg)  10/11/11 282 lb (127.9 kg)   Diet: general Exercise: never, none Need for contraception: Yes, condoms  Patient Active Problem List   Diagnosis Date Noted  . Hypothyroidism (acquired) 03/26/2015  . GERD (gastroesophageal reflux disease) 09/05/2012  . Solitary nodule of right lobe of thyroid 09/05/2012  . Hashimoto disease 10/11/2011  . Hx of postpartum  depression, currently pregnant 10/11/2011  . RhD negative 10/11/2011  . PCOS (polycystic ovarian syndrome) 12/10/2010   Health Maintenance  Topic Date Due  . PAP SMEAR  12/25/2000  . INFLUENZA VACCINE  10/31/2018 (Originally 06/14/2017)  . TETANUS/TDAP  03/25/2025  . HIV Screening  Completed   Immunization History  Administered Date(s) Administered  . Rho (D) Immune Globulin 07/21/2011, 10/13/2011  . Tdap 03/26/2015   We updated and reviewed the patient's past history in detail and it is documented below. Allergies: Patient is allergic to latex. Past Medical History Patient  has a past medical history of Depression, Fibroid, Hypothyroidism, and Polycystic ovarian syndrome. Past Surgical History Patient  has a past surgical history that includes Cesarean section and Cholecystectomy (07/20/2012). Family History: Patient family history includes Colon cancer in her maternal grandfather and maternal grandmother; Diabetes in her maternal grandmother. Social History:  Patient  reports that  has never smoked. she has never used smokeless tobacco. She reports that she does not drink alcohol or use drugs.  Review of Systems: Constitutional: negative for fever or malaise Ophthalmic: negative for photophobia, double vision or loss of vision Cardiovascular: negative for chest pain, dyspnea on exertion, or new LE swelling Respiratory: negative for SOB or persistent cough Gastrointestinal: negative for abdominal pain, change in bowel habits or melena Genitourinary: negative for dysuria or gross hematuria, no abnormal uterine bleeding or disharge Musculoskeletal: negative for new gait disturbance or muscular  weakness Integumentary: negative for new or persistent rashes, no breast lumps Neurological: negative for TIA or stroke symptoms Psychiatric: negative for SI or delusions Allergic/Immunologic: negative for hives Patient Care Team    Relationship Specialty Notifications Start End  Leamon Arnt, MD PCP - General Family Medicine  07/18/17   Jacelyn Pi, MD Consulting Physician Endocrinology  10/31/17     Objective  Vitals: BP 116/70 (BP Location: Left Arm, Patient Position: Sitting, Cuff Size: Large)   Pulse 71   Temp 98.1 F (36.7 C) (Oral)   Ht 5' 8.5" (1.74 m)   Wt 273 lb 8 oz (124.1 kg)   LMP 10/22/2017   SpO2 97%   Breastfeeding? No   BMI 40.98 kg/m  General:  Well developed, well nourished, no acute distress  Psych:  Alert and orientedx3,normal mood and affect HEENT:  Normocephalic, atraumatic, non-icteric sclera, PERRL, oropharynx is clear without mass or exudate, supple neck without adenopathy, mass or thyromegaly Cardiovascular:  Normal S1, S2, RRR without gallop, rub or murmur, nondisplaced PMI Respiratory:  Good breath sounds bilaterally, CTAB with normal respiratory effort Gastrointestinal: normal bowel sounds, soft, non-tender, no noted masses. No HSM MSK: no deformities, contusions. Joints are without erythema or swelling. Spine and CVA region are nontender Skin:  Warm, no rashes or suspicious lesions noted Neurologic:    Mental status is normal. CN 2-11 are normal. Gross motor and sensory exams are normal. Normal gait. No tremor Breast Exam: No mass, skin retraction or nipple discharge is appreciated in either breast. No axillary adenopathy. Fibrocystic changes are not noted Pelvic Exam: Normal external genitalia, no vulvar or vaginal lesions present. Clear cervix w/o CMT. Bimanual exam reveals a nontender fundus w/o masses, nl size. No adnexal masses present. No inguinal adenopathy. A PAP smear was performed.   Assessment  1. Annual physical exam   2. Cervical cancer screening   3. Hypothyroidism (acquired)      Plan  Female Wellness Visit:  Age appropriate Health Maintenance and Prevention measures were discussed with patient. Included topics are cancer screening recommendations, ways to keep healthy (see AVS) including dietary and exercise  recommendations, regular eye and dental care, use of seat belts, and avoidance of moderate alcohol use and tobacco use.  Pap smear with HPV screening done today.  BMI: discussed patient's BMI and encouraged positive lifestyle modifications to help get to or maintain a target BMI.  Educated on recommendations for exercise program and weight loss.  HM needs and immunizations were addressed and ordered. See below for orders. See HM and immunization section for updates.  Influenza vaccination updated today  Routine labs and screening tests ordered including cmp, cbc and lipids where appropriate.  Discussed recommendations regarding Vit D and calcium supplementation (see AVS)  TMJ: Discussed management of acute inflammation with Advil for 1 week.  Patient to get a dental guard from her dentist.  See AVS  Hypothyroidism managed by endocrinology 4 weeks after a dose decrease.  Reassured, I do not believe her cold hands and feet have anything to do with her thyroid problems.  Most likely due to the colder weather.  Follow up: One year for complete physical   Commons side effects, risks, benefits, and alternatives for medications and treatment plan prescribed today were discussed, and the patient expressed understanding of the given instructions. Patient is instructed to call or message via MyChart if he/she has any questions or concerns regarding our treatment plan. No barriers to understanding were identified. We discussed Red Flag  symptoms and signs in detail. Patient expressed understanding regarding what to do in case of urgent or emergency type symptoms.   Medication list was reconciled, printed and provided to the patient in AVS. Patient instructions and summary information was reviewed with the patient as documented in the AVS. This note was prepared with assistance of Dragon voice recognition software. Occasional wrong-word or sound-a-like substitutions may have occurred due to the inherent  limitations of voice recognition software  Orders Placed This Encounter  Procedures  . CBC with Differential/Platelet  . Comprehensive metabolic panel  . Lipid panel  . TSH   No orders of the defined types were placed in this encounter.

## 2017-11-01 ENCOUNTER — Encounter: Payer: Self-pay | Admitting: Family Medicine

## 2017-11-01 LAB — CYTOLOGY - PAP
Diagnosis: NEGATIVE
HPV (WINDOPATH): NOT DETECTED

## 2017-11-01 NOTE — Progress Notes (Signed)
Lab results mailed to patient in letter. Normal results. No action / follow up needed on these results.  Consider iron due to microcytosis suggested.

## 2017-11-01 NOTE — Progress Notes (Signed)
Lab results mailed to patient in letter. Normal results. No action / follow up needed on these results.

## 2018-03-31 IMAGING — US US THYROID
1 series · 13 of 25 positions shown · non-contrast
Comparison: 06/27/2016;

CLINICAL DATA: Prior ultrasound follow-up. History of left-sided
thyroid nodule fine-needle aspiration

EXAM:
THYROID ULTRASOUND
TECHNIQUE: Ultrasound examination of the thyroid gland and adjacent soft
tissues was performed.

[Series 1: us thyroid · 0.07mm/px · 13 of 42 slices shown]
[im 1/42]
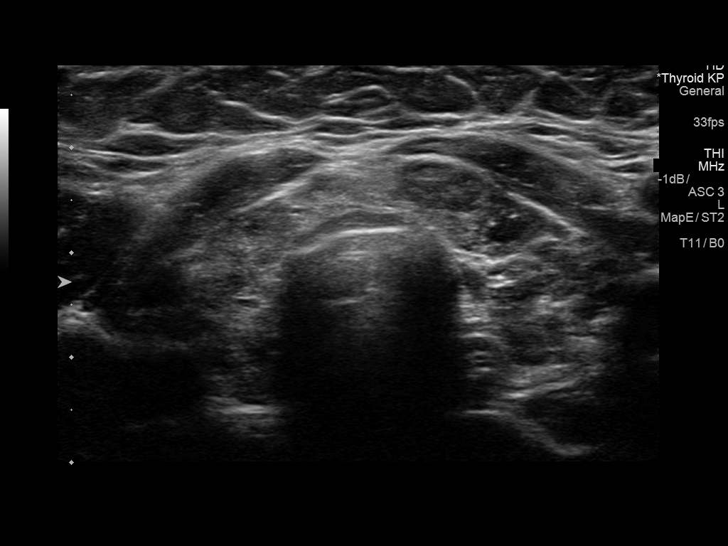
[im 4/42]
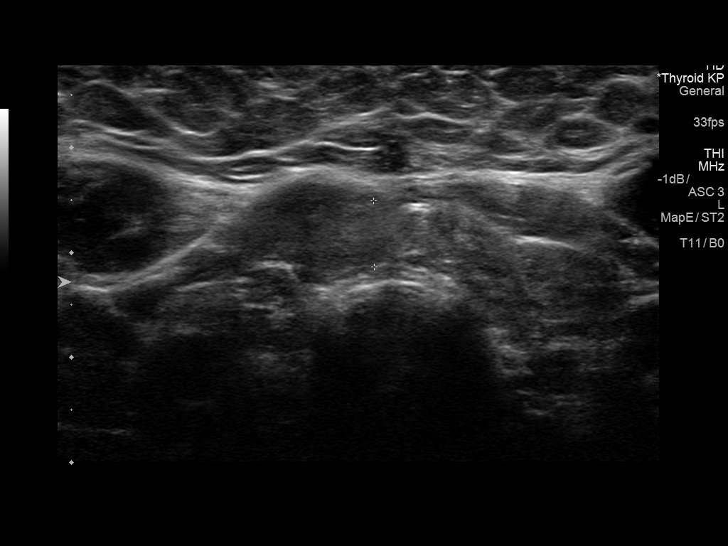
[im 7/42]
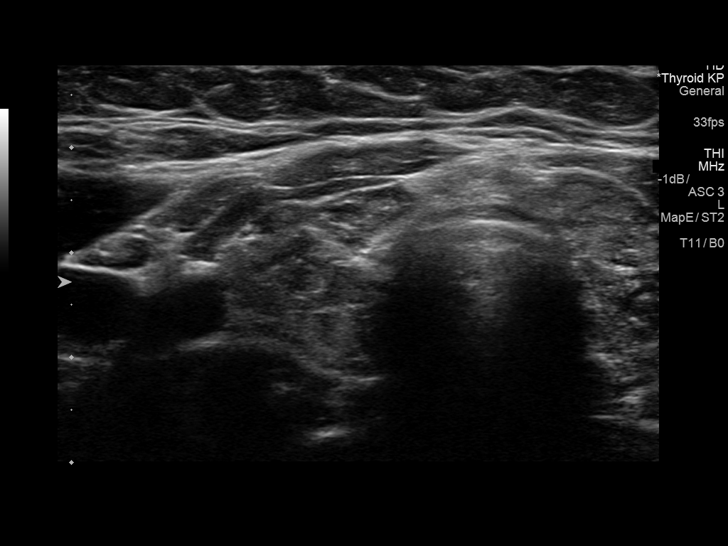
[im 11/42]
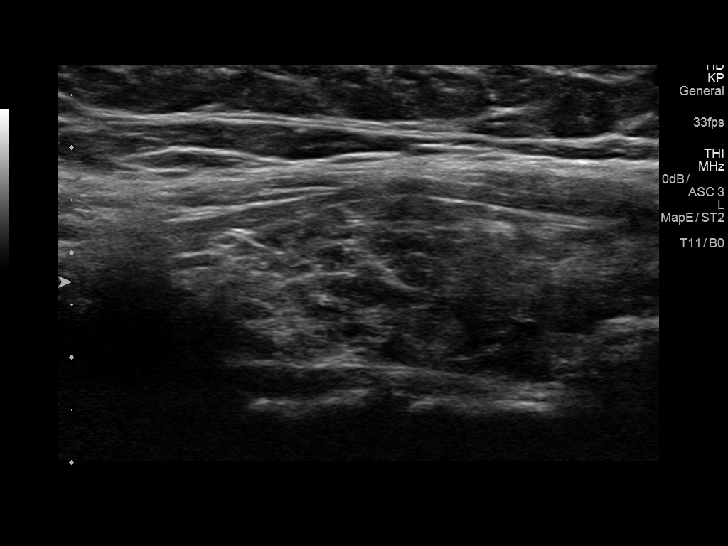
[im 14/42]
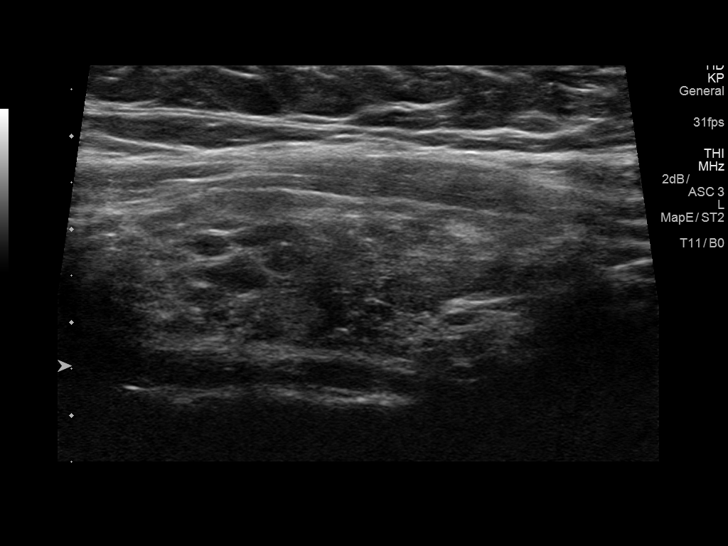
[im 18/42]
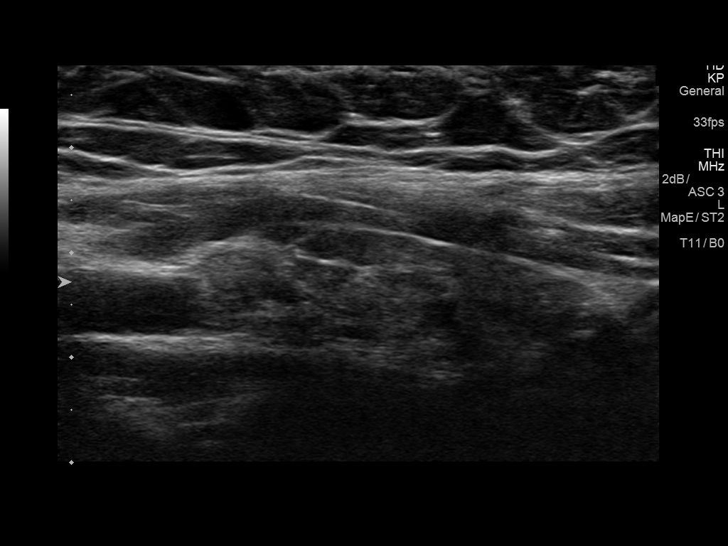
[im 21/42]
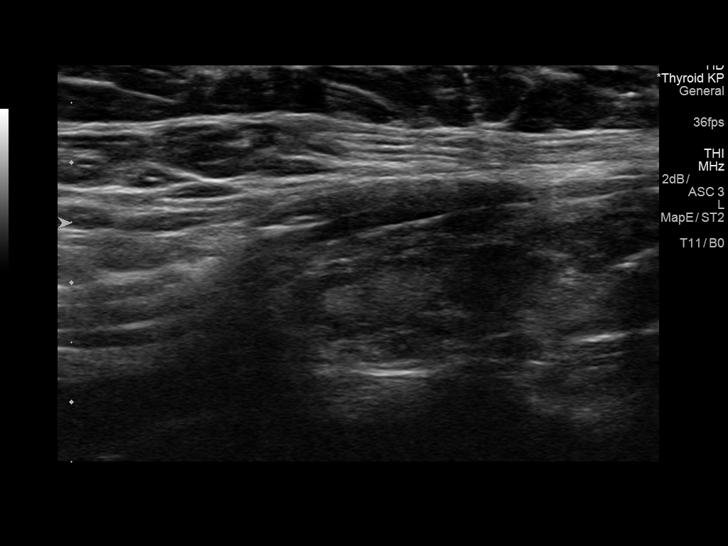
[im 24/42]
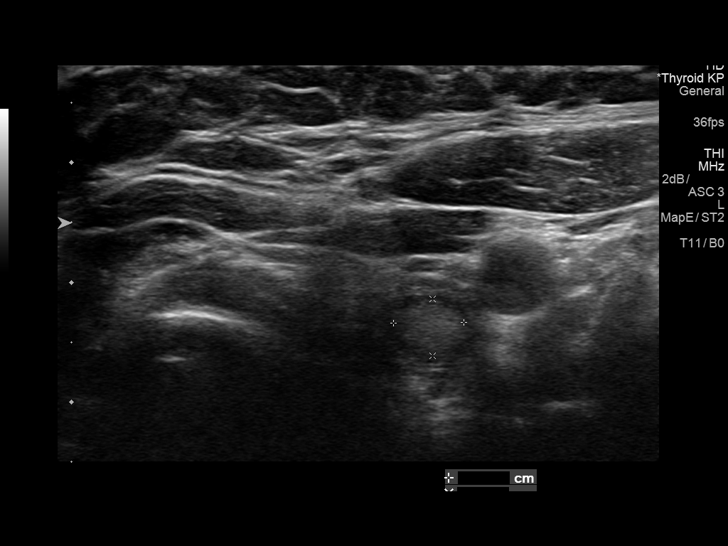
[im 28/42]
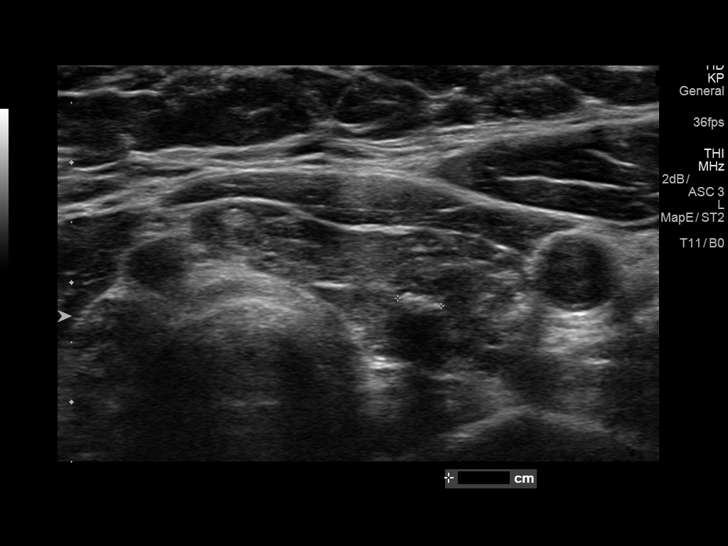
[im 31/42]
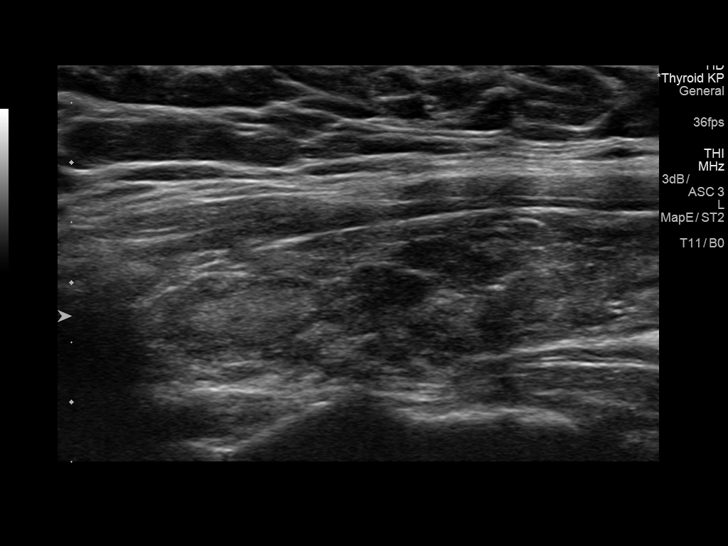
[im 35/42]
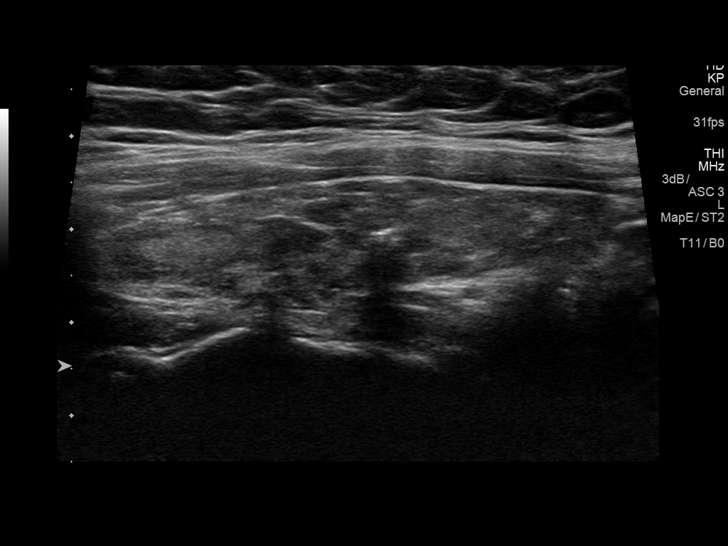
[im 38/42]
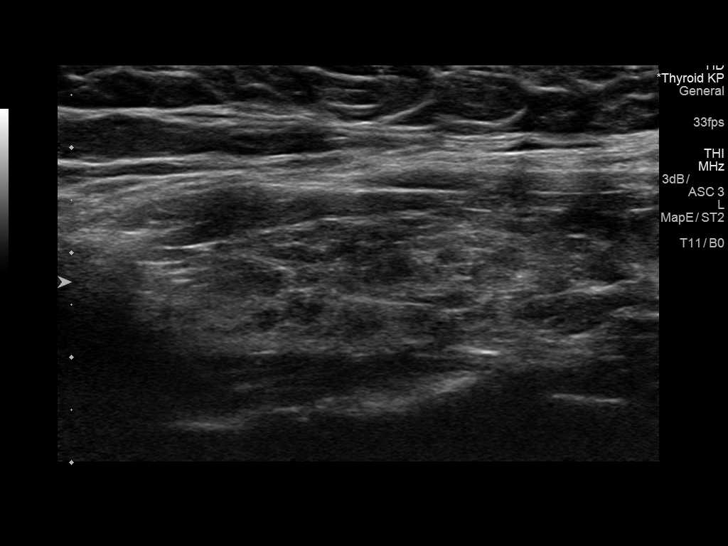
[im 42/42]
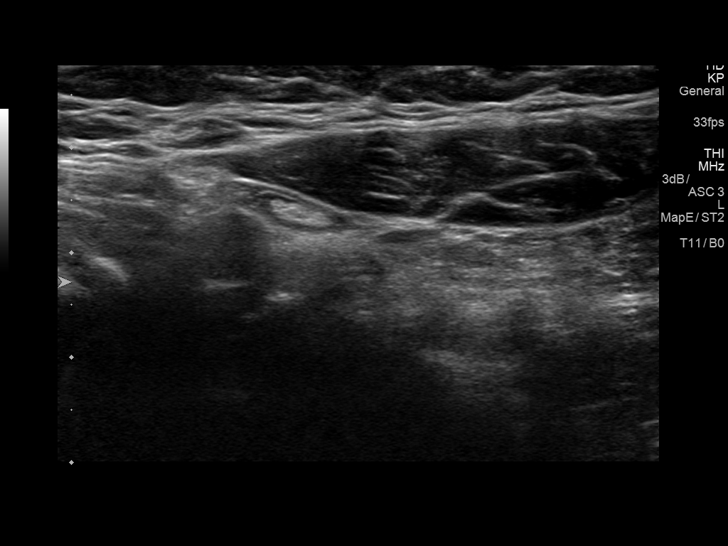

[13 of 25 positions shown; findings below may reference images not displayed]

05/21/2012; ultrasound-guided left-sided
thyroid nodule fine-needle aspiration - 07/17/2012
FINDINGS: Parenchymal Echotexture: Markedly heterogenous

Isthmus: Normal in size measures 0.7 cm in diameter, unchanged

Right lobe: Normal in size measuring 5.1 x 1.7 x 1.7 cm, unchanged,
previously, 5.8 x 1.2 x 1.7 cm

Left lobe: Normal in size measuring 5.7 x 1.4 x 1.5 cm, unchanged,
previously, 6.0 x 1.7 x 2.2 cm

_________________________________________________________

Estimated total number of nodules >/= 1 cm: 1

Number of spongiform nodules >/=  2 cm not described below (TR1): 0

Number of mixed cystic and solid nodules >/= 1.5 cm not described
below (TR2): 0

_________________________________________________________

The previously biopsied approximately 1.0 x 0.6 x 0.5 cm nodule/
pseudo nodule within the superior pole the left lobe of the thyroid
is unchanged to decreased in size compared to remote examination
performed [DATE], previously, 1.4 x 1.0 x 0.7 cm. Correlation with
prior biopsy results is recommended.

Punctate (approximately 0.4 cm) echogenic dystrophic calcification
within mid aspect of the left lobe of the thyroid demonstrates
posterior acoustic shadowing (image 29) and is unchanged compared to
the [DATE] examination and does not meet imaging criteria to
recommend percutaneous sampling or dedicated follow-up.
IMPRESSION: 1. Similar appearing markedly heterogeneous thyroid gland without
new or enlarging thyroid nodule or mass.
2. Previously biopsied approximately 1 cm nodule/pseudo nodule
within the superior pole of the left lobe of the thyroid is
unchanged to decreased in size compared to the [DATE] examination.
Correlation with prior biopsy results is recommended. Assuming a
benign pathologic diagnosis, repeat sampling and/or continued
dedicated follow-up is not recommended.
The above is in keeping with the ACR TI-RADS recommendations - [HOSPITAL] 1249;[DATE].

## 2018-05-03 ENCOUNTER — Ambulatory Visit (INDEPENDENT_AMBULATORY_CARE_PROVIDER_SITE_OTHER): Payer: 59 | Admitting: Family Medicine

## 2018-05-03 ENCOUNTER — Other Ambulatory Visit: Payer: Self-pay

## 2018-05-03 ENCOUNTER — Encounter: Payer: Self-pay | Admitting: Family Medicine

## 2018-05-03 ENCOUNTER — Ambulatory Visit: Payer: 59 | Admitting: Family Medicine

## 2018-05-03 VITALS — BP 118/78 | HR 79 | Temp 98.5°F | Resp 15 | Ht 68.0 in | Wt 282.0 lb

## 2018-05-03 DIAGNOSIS — J029 Acute pharyngitis, unspecified: Secondary | ICD-10-CM

## 2018-05-03 LAB — POCT RAPID STREP A (OFFICE): Rapid Strep A Screen: NEGATIVE

## 2018-05-03 MED ORDER — PENICILLIN V POTASSIUM 500 MG PO TABS: 500.0000 mg | ORAL_TABLET | Freq: Two times a day (BID) | ORAL | 0 refills | Status: DC

## 2018-05-03 NOTE — Patient Instructions (Signed)
Please follow up if symptoms do not improve or as needed.  We will let you know what the culture results show early next week.   Pharyngitis Pharyngitis is redness, pain, and swelling (inflammation) of the throat (pharynx). It is a very common cause of sore throat. Pharyngitis can be caused by a bacteria, but it is usually caused by a virus. Most cases of pharyngitis get better on their own without treatment. What are the causes? This condition may be caused by:  Infection by viruses (viral). Viral pharyngitis spreads from person to person (is contagious) through coughing, sneezing, and sharing of personal items or utensils such as cups, forks, spoons, and toothbrushes.  Infection by bacteria (bacterial). Bacterial pharyngitis may be spread by touching the nose or face after coming in contact with the bacteria, or through more intimate contact, such as kissing.  Allergies. Allergies can cause buildup of mucus in the throat (post-nasal drip), leading to inflammation and irritation. Allergies can also cause blocked nasal passages, forcing breathing through the mouth, which dries and irritates the throat.  What increases the risk? You are more likely to develop this condition if:  You are 71-32 years old.  You are exposed to crowded environments such as daycare, school, or dormitory living.  You live in a cold climate.  You have a weakened disease-fighting (immune) system.  What are the signs or symptoms? Symptoms of this condition vary by the cause (viral, bacterial, or allergies) and can include:  Sore throat.  Fatigue.  Low-grade fever.  Headache.  Joint pain and muscle aches.  Skin rashes.  Swollen glands in the throat (lymph nodes).  Plaque-like film on the throat or tonsils. This is often a symptom of bacterial pharyngitis.  Vomiting.  Stuffy nose (nasal congestion).  Cough.  Red, itchy eyes (conjunctivitis).  Loss of appetite.  How is this diagnosed? This  condition is often diagnosed based on your medical history and a physical exam. Your health care provider will ask you questions about your illness and your symptoms. A swab of your throat may be done to check for bacteria (rapid strep test). Other lab tests may also be done, depending on the suspected cause, but these are rare. How is this treated? This condition usually gets better in 3-4 days without medicine. Bacterial pharyngitis may be treated with antibiotic medicines. Follow these instructions at home:  Take over-the-counter and prescription medicines only as told by your health care provider. ? If you were prescribed an antibiotic medicine, take it as told by your health care provider. Do not stop taking the antibiotic even if you start to feel better. ? Do not give children aspirin because of the association with Reye syndrome.  Drink enough water and fluids to keep your urine clear or pale yellow.  Get a lot of rest.  Gargle with a salt-water mixture 3-4 times a day or as needed. To make a salt-water mixture, completely dissolve -1 tsp of salt in 1 cup of warm water.  If your health care provider approves, you may use throat lozenges or sprays to soothe your throat. Contact a health care provider if:  You have large, tender lumps in your neck.  You have a rash.  You cough up green, yellow-brown, or bloody spit. Get help right away if:  Your neck becomes stiff.  You drool or are unable to swallow liquids.  You cannot drink or take medicines without vomiting.  You have severe pain that does not go away, even after  you take medicine.  You have trouble breathing, and it is not caused by a stuffy nose.  You have new pain and swelling in your joints such as the knees, ankles, wrists, or elbows. Summary  Pharyngitis is redness, pain, and swelling (inflammation) of the throat (pharynx).  While pharyngitis can be caused by a bacteria, the most common causes are  viral.  Most cases of pharyngitis get better on their own without treatment.  Bacterial pharyngitis is treated with antibiotic medicines. This information is not intended to replace advice given to you by your health care provider. Make sure you discuss any questions you have with your health care provider. Document Released: 10/31/2005 Document Revised: 12/06/2016 Document Reviewed: 12/06/2016 Elsevier Interactive Patient Education  [employer].

## 2018-05-03 NOTE — Progress Notes (Signed)
Subjective  CC:  Chief Complaint  Patient presents with  . Sore Throat    started Tuesday and worse yesterday    HPI: Renee Thompson is a 37 y.o. female who presents to the office today to address the problems listed above in the chief complaint.  C/o sore throat,  without fever. No SOB or GI sxs. No known exposure ot strep or mono however she recently returned from [place], travel was accompanying multiple adolescents will. OTC analgesics have been used with minimal or mild relief. Eating and drinking OK.   I reviewed the patients updated PMH, FH, and SocHx.    Patient Active Problem List   Diagnosis Date Noted  . Morbid obesity ([place]) 10/31/2017  . Hypothyroidism (acquired) 03/26/2015  . GERD (gastroesophageal reflux disease) 09/05/2012  . Solitary nodule of right lobe of thyroid 09/05/2012  . Hashimoto disease 10/11/2011  . Hx of postpartum depression, currently pregnant 10/11/2011  . RhD negative 10/11/2011  . PCOS (polycystic ovarian syndrome) 12/10/2010   Current Meds  Medication Sig  . ibuprofen (ADVIL,MOTRIN) 200 MG tablet Take 600 mg by mouth as needed.  . thyroid (ARMOUR THYROID) 180 MG tablet Take by mouth.    Allergies: Patient is allergic to latex.  Review of Systems: Constitutional: Negative for fever malaise or anorexia Cardiovascular: negative for chest pain Respiratory: negative for SOB or persistent cough Gastrointestinal: negative for abdominal pain  Objective  Vitals: BP 118/78   Pulse 79   Temp 98.5 F (36.9 C) (Oral)   Resp 15   Ht 5\' 8"  (1.727 m)   Wt 282 lb (127.9 kg)   SpO2 98%   BMI 42.88 kg/m  General: no acute distress , A&Ox3 HEENT: PEERL, conjunctiva normal, bilateral EAC and TMs are normal. Nares normal. Oropharynx moist with erythematous posterior pharynx without exudate with red spots, + cervical LAD, 2+ tonsils, midline uvula, neck is supple Cardiovascular:  RRR without murmur or gallop.  Respiratory:  Good breath sounds  bilaterally, CTAB with normal respiratory effort Skin:  Warm, no rashes  Office Visit on 05/03/2018  Component Date Value Ref Range Status  . Rapid Strep A Screen 05/03/2018 Negative  Negative Final    Assessment  1. Sore throat      Plan   Supportive care with advil, tylenol, gargles etc discussed.  Patient had similar course several years ago and culture came back positive.  Treat with penicillin and follow-up once culture returns.  Supportive care. RTO if sxs persist or worsen.   Follow up: CPE due in December   Commons side effects, risks, benefits, and alternatives for medications and treatment plan prescribed today were discussed, and the patient expressed understanding of the given instructions. Patient is instructed to call or message via MyChart if he/she has any questions or concerns regarding our treatment plan. No barriers to understanding were identified. We discussed Red Flag symptoms and signs in detail. Patient expressed understanding regarding what to do in case of urgent or emergency type symptoms.   Medication list was reconciled, printed and provided to the patient in AVS. Patient instructions and summary information was reviewed with the patient as documented in the AVS. This note was prepared with assistance of Dragon voice recognition software. Occasional wrong-word or sound-a-like substitutions may have occurred due to the inherent limitations of voice recognition software  Orders Placed This Encounter  Procedures  . Culture, Group A Strep  . POCT rapid strep A   Meds ordered this encounter  Medications  .  penicillin v potassium (VEETID) 500 MG tablet    Sig: Take 1 tablet (500 mg total) by mouth 2 (two) times daily.    Dispense:  20 tablet    Refill:  0

## 2018-05-05 LAB — CULTURE, GROUP A STREP
MICRO NUMBER: 90742160
SPECIMEN QUALITY: ADEQUATE

## 2018-07-18 DIAGNOSIS — E049 Nontoxic goiter, unspecified: Secondary | ICD-10-CM | POA: Diagnosis not present

## 2018-07-25 DIAGNOSIS — E039 Hypothyroidism, unspecified: Secondary | ICD-10-CM | POA: Diagnosis not present

## 2018-07-25 DIAGNOSIS — E049 Nontoxic goiter, unspecified: Secondary | ICD-10-CM | POA: Diagnosis not present

## 2019-01-15 ENCOUNTER — Other Ambulatory Visit: Payer: Self-pay

## 2019-01-15 ENCOUNTER — Ambulatory Visit (INDEPENDENT_AMBULATORY_CARE_PROVIDER_SITE_OTHER): Payer: 59 | Admitting: Family Medicine

## 2019-01-15 ENCOUNTER — Encounter: Payer: Self-pay | Admitting: Family Medicine

## 2019-01-15 VITALS — BP 114/74 | HR 76 | Temp 98.0°F | Resp 16 | Ht 67.5 in | Wt 263.2 lb

## 2019-01-15 DIAGNOSIS — Z0000 Encounter for general adult medical examination without abnormal findings: Secondary | ICD-10-CM | POA: Diagnosis not present

## 2019-01-15 DIAGNOSIS — Z62810 Personal history of physical and sexual abuse in childhood: Secondary | ICD-10-CM

## 2019-01-15 DIAGNOSIS — E039 Hypothyroidism, unspecified: Secondary | ICD-10-CM | POA: Diagnosis not present

## 2019-01-15 DIAGNOSIS — E282 Polycystic ovarian syndrome: Secondary | ICD-10-CM | POA: Diagnosis not present

## 2019-01-15 HISTORY — DX: Personal history of physical and sexual abuse in childhood: Z62.810

## 2019-01-15 NOTE — Patient Instructions (Addendum)
Please return in 12 months for your annual complete physical; please come fasting.  Please take care of yourself!! And thank you for today.   If you have any questions or concerns, please don't hesitate to send me a message via MyChart or call the office at [phone]. Thank you for visiting with [place] today! It's our pleasure caring for you.  Please do these things to maintain good health!   Exercise at least 30-45 minutes a day,  4-5 days a week.   Eat a low-fat diet with lots of fruits and vegetables, up to 7-9 servings per day.  Drink plenty of water daily. Try to drink 8 8oz glasses per day.  Seatbelts can save your life. Always wear your seatbelt.  Place Smoke Detectors on every level of your home and check batteries every year.  Schedule an appointment with an eye doctor for an eye exam every 1-2 years  Safe sex - use condoms to protect yourself from STDs if you could be exposed to these types of infections. Use birth control if you do not want to become pregnant and are sexually active.  Avoid heavy alcohol use. If you drink, keep it to less than 2 drinks/day and not every day.  [place].  Choose someone you trust that could speak for you if you became unable to speak for yourself.  Depression is common in our stressful world.If you're feeling down or losing interest in things you normally enjoy, please come in for a visit.  If anyone is threatening or hurting you, please get help. Physical or Emotional Violence is never OK.
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
## 2019-01-16 LAB — HEMOGLOBIN A1C
Hgb A1c MFr Bld: 5.4 % of total Hgb (ref <5.7)
Mean Plasma Glucose: 108 (calc)
eAG (mmol/L): 6 (calc)

## 2019-01-16 LAB — LIPID PANEL
CHOL/HDL RATIO: 3.3 (calc) (ref <5.0)
Cholesterol: 172 mg/dL (ref <200)
HDL: 52 mg/dL (ref >=50)
LDL Cholesterol (Calc): 97 mg/dL (calc)
NON-HDL CHOLESTEROL (CALC): 120 mg/dL (ref <130)
TRIGLYCERIDES: 135 mg/dL (ref <150)

## 2019-01-16 LAB — COMPREHENSIVE METABOLIC PANEL
AG RATIO: 1.6 (calc) (ref 1.0–2.5)
ALBUMIN MSPROF: 4.3 g/dL (ref 3.6–5.1)
ALT: 8 U/L (ref 6–29)
AST: 10 U/L (ref 10–30)
Alkaline phosphatase (APISO): 97 U/L (ref 31–125)
BUN: 11 mg/dL (ref 7–25)
CHLORIDE: 103 mmol/L (ref 98–110)
CO2: 26 mmol/L (ref 20–32)
CREATININE: 0.71 mg/dL (ref 0.50–1.10)
Calcium: 9.4 mg/dL (ref 8.6–10.2)
GLOBULIN: 2.7 g/dL (ref 1.9–3.7)
GLUCOSE: 82 mg/dL (ref 65–99)
POTASSIUM: 4.7 mmol/L (ref 3.5–5.3)
Sodium: 138 mmol/L (ref 135–146)
Total Bilirubin: 0.5 mg/dL (ref 0.2–1.2)
Total Protein: 7 g/dL (ref 6.1–8.1)

## 2019-01-16 LAB — CBC WITH DIFFERENTIAL/PLATELET
ABSOLUTE MONOCYTES: 496 {cells}/uL (ref 200–950)
BASOS PCT: 0.5 %
Basophils Absolute: 59 cells/uL (ref 0–200)
Eosinophils Absolute: 130 cells/uL (ref 15–500)
Eosinophils Relative: 1.1 %
HCT: 39.3 % (ref 35.0–45.0)
HEMOGLOBIN: 12.9 g/dL (ref 11.7–15.5)
Lymphs Abs: 3410 cells/uL (ref 850–3900)
MCH: 24.6 pg — ABNORMAL LOW (ref 27.0–33.0)
MCHC: 32.8 g/dL (ref 32.0–36.0)
MCV: 74.9 fL — AB (ref 80.0–100.0)
MONOS PCT: 4.2 %
MPV: 11.8 fL (ref 7.5–12.5)
NEUTROS ABS: 7705 {cells}/uL (ref 1500–7800)
Neutrophils Relative %: 65.3 %
PLATELETS: 289 10*3/uL (ref 140–400)
RBC: 5.25 10*6/uL — AB (ref 3.80–5.10)
RDW: 15.8 % — ABNORMAL HIGH (ref 11.0–15.0)
TOTAL LYMPHOCYTE: 28.9 %
WBC: 11.8 10*3/uL — AB (ref 3.8–10.8)

## 2019-01-16 LAB — TSH: TSH: 0.08 mIU/L — ABNORMAL LOW

## 2019-07-08 ENCOUNTER — Other Ambulatory Visit: Payer: Self-pay | Admitting: Endocrinology

## 2019-07-08 DIAGNOSIS — E049 Nontoxic goiter, unspecified: Secondary | ICD-10-CM

## 2019-07-29 ENCOUNTER — Ambulatory Visit
Admission: RE | Admit: 2019-07-29 | Discharge: 2019-07-29 | Disposition: A | Discharge status: Home / Self Care | Payer: 59 | Source: Ambulatory Visit | Attending: Endocrinology | Admitting: Endocrinology

## 2019-07-29 DIAGNOSIS — E049 Nontoxic goiter, unspecified: Secondary | ICD-10-CM

## 2019-10-24 IMAGING — US US THYROID
1 series · 13 of 25 positions shown · non-contrast
Comparison: July 18, 2017

CLINICAL DATA: 38-year-old female with thyroid goiter

Prior biopsy of superior left thyroid nodule
EXAM:
THYROID ULTRASOUND
TECHNIQUE: Ultrasound examination of the thyroid gland and adjacent soft
tissues was performed.

[Series 1: us thyroid · 0.07mm/px · 13 of 48 slices shown]
[im 1/48]
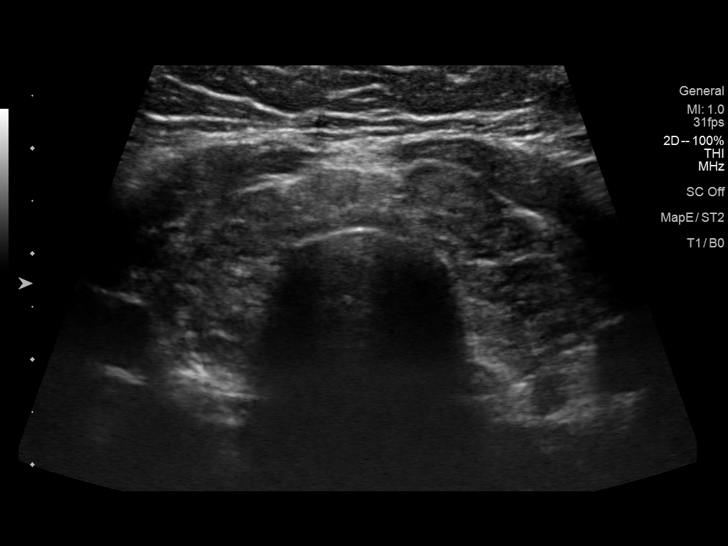
[im 4/48]
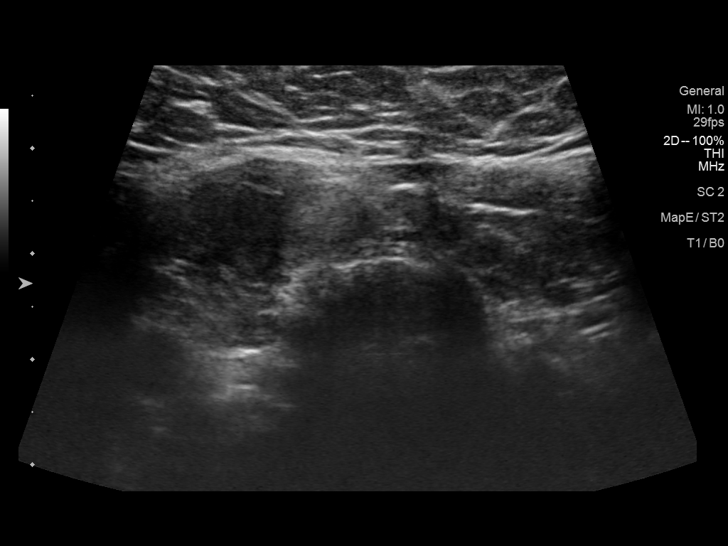
[im 8/48]
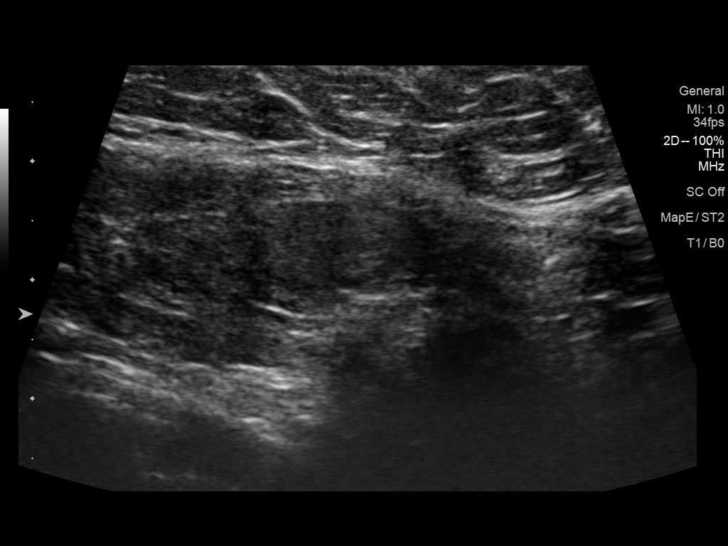
[im 12/48]
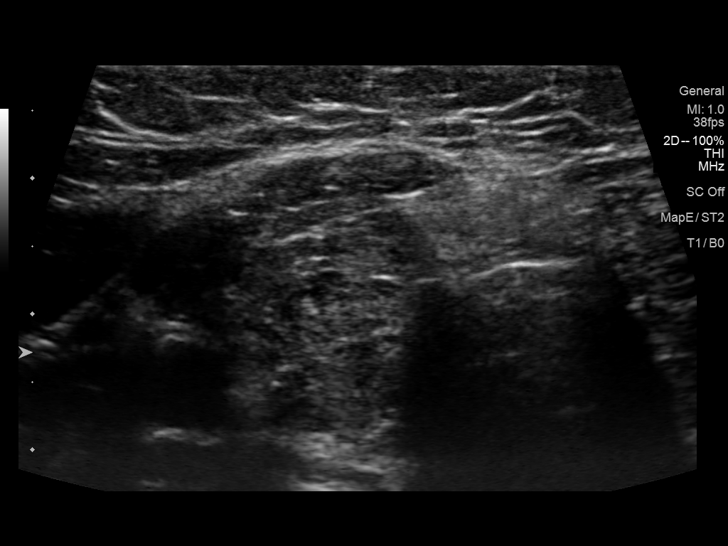
[im 16/48]
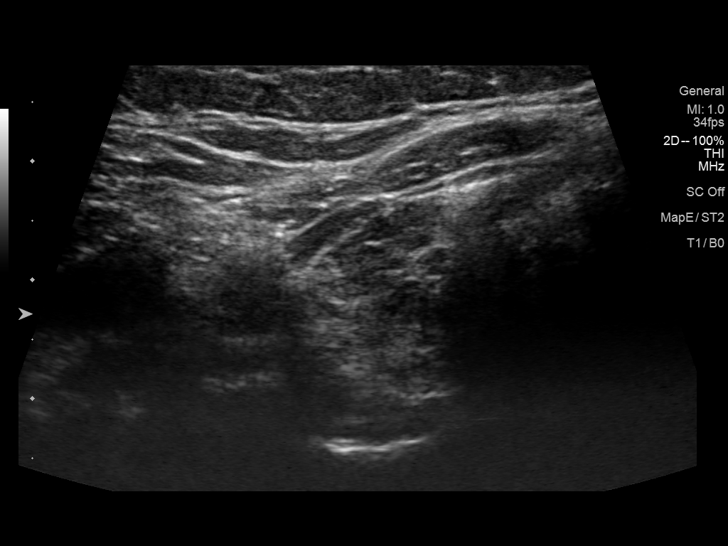
[im 20/48]
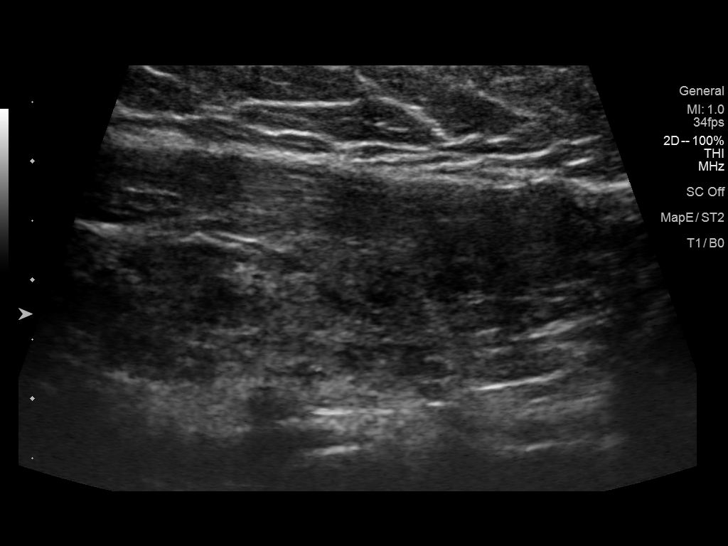
[im 24/48]
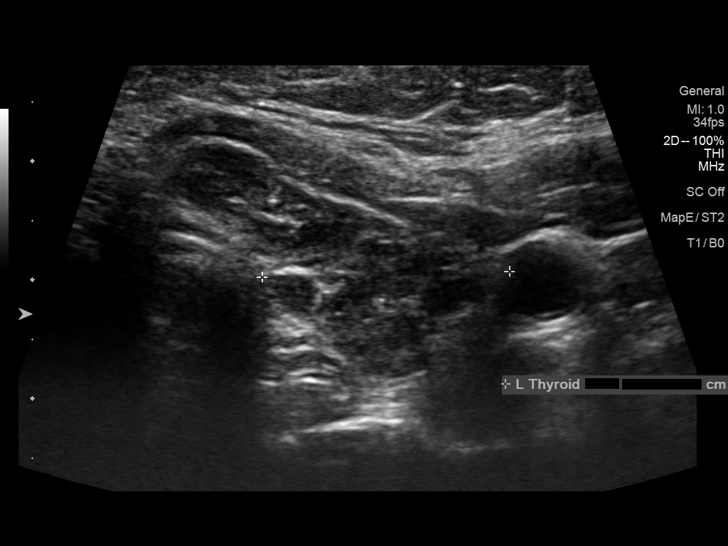
[im 28/48]
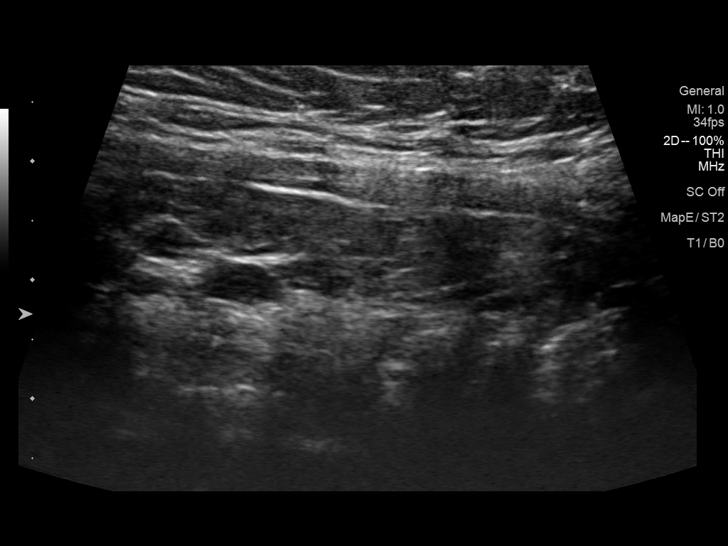
[im 32/48]
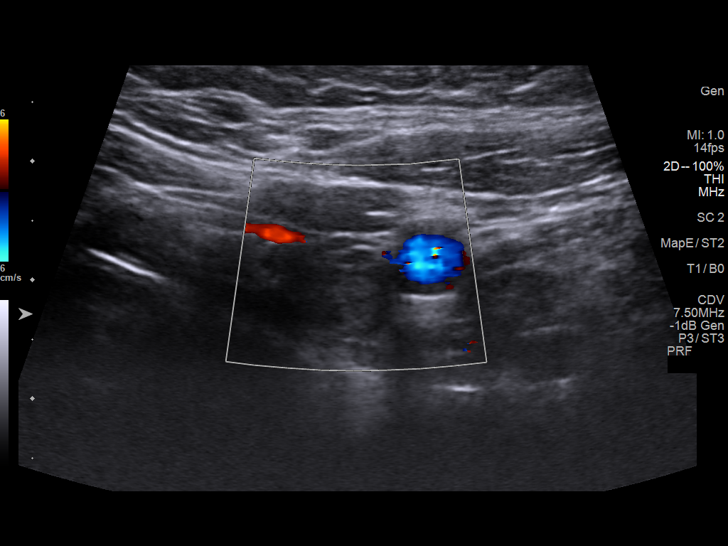
[im 36/48]
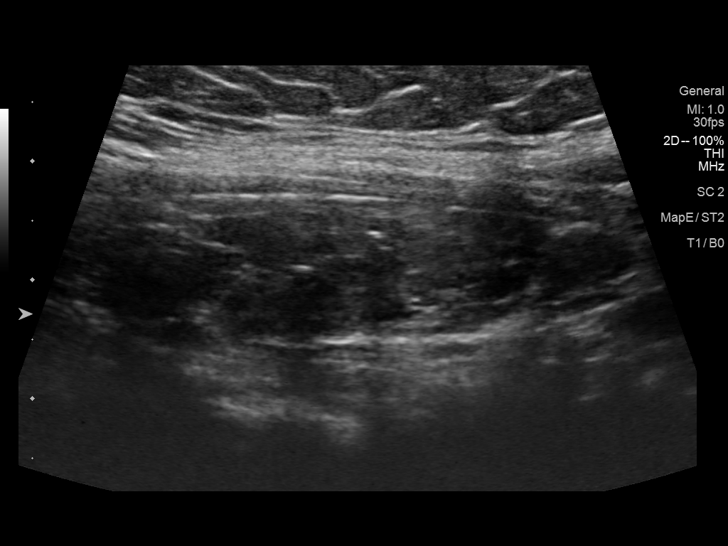
[im 40/48]
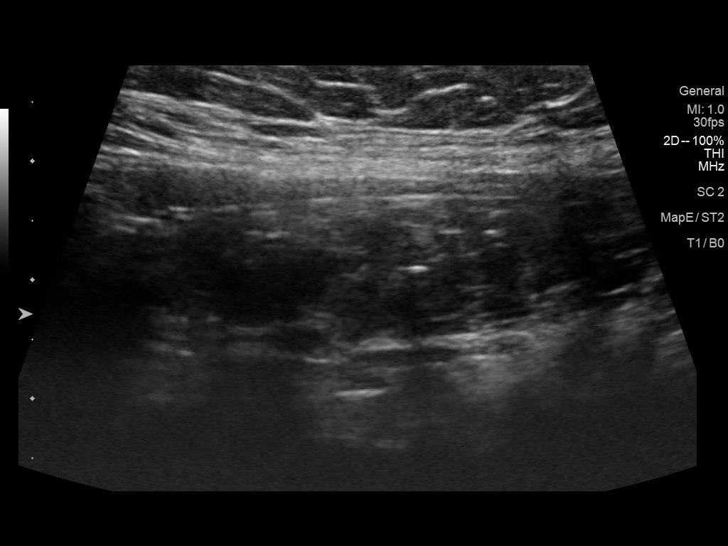
[im 44/48]
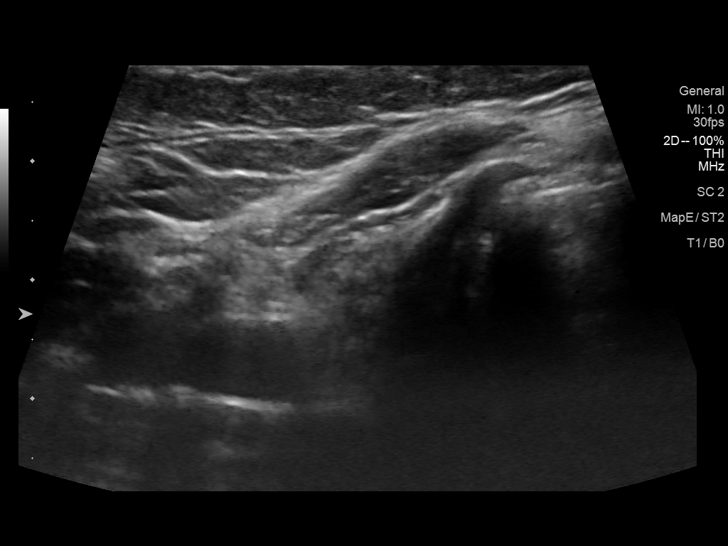
[im 48/48]
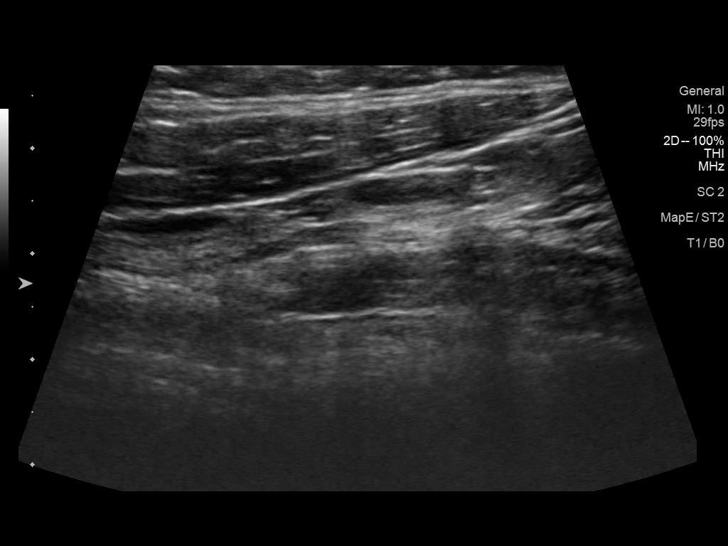

[13 of 25 positions shown; findings below may reference images not displayed]

FINDINGS: Parenchymal Echotexture: Markedly heterogenous

Isthmus: 0.7 cm

Right lobe: 4.1 cm x 1.6 cm x 1.4 cm

Left lobe: 4.6 cm x 0.9 cm x 2.1 cm

_________________________________________________________

Estimated total number of nodules >/= 1 cm: 2

Number of spongiform nodules >/=  2 cm not described below (TR1): 0

Number of mixed cystic and solid nodules >/= 1.5 cm not described
below (TR2): 0

_________________________________________________________

Nodule # 1:

Location: Isthmus; mid

Maximum size: 1.4 cm; Other 2 dimensions: 1.3 cm x 0.9 cm

Composition: cannot determine (2)

Echogenicity: isoechoic (1)

Shape: not taller-than-wide (0)

Margins: ill-defined (0)

Echogenic foci: none (0)

ACR TI-RADS total points: 3.

ACR TI-RADS risk category: TR3 (3 points).

ACR TI-RADS recommendations:

Nodule does not meet criteria for surveillance or biopsy

_________________________________________________________

Nodule labeled 2 in the superior left thyroid is unchanged in size
1.0 cm. This nodule has been previously biopsied

No adenopathy
IMPRESSION: Isthmic thyroid nodule does not meet criteria for biopsy or
surveillance, as designated by the newly established ACR TI-RADS
criteria.

Nodule within the left upper thyroid is unchanged, and previously
biopsied. Assuming a benign result, no further specific follow-up
would be indicated.

Recommendations follow those established by the new ACR TI-RADS
criteria ([HOSPITAL] 3558;[DATE]).

## 2019-10-26 ENCOUNTER — Other Ambulatory Visit: Payer: Self-pay

## 2019-10-26 DIAGNOSIS — Z20822 Contact with and (suspected) exposure to covid-19: Secondary | ICD-10-CM

## 2019-10-28 LAB — NOVEL CORONAVIRUS, NAA: SARS-CoV-2, NAA: NOT DETECTED

## 2022-08-08 ENCOUNTER — Encounter: Payer: Self-pay | Admitting: *Deleted

## 2022-10-27 ENCOUNTER — Encounter: Payer: Self-pay | Admitting: *Deleted
